# Patient Record
Sex: Female | Born: 1985 | Race: White | Hispanic: No | Marital: Married | Smoking: Former smoker
Health system: Southern US, Community
[De-identification: ages and names within clinical notes are randomized; demographics above are authoritative.]

## PROBLEM LIST (undated history)

## (undated) DIAGNOSIS — IMO0002 Reserved for concepts with insufficient information to code with codable children: Secondary | ICD-10-CM

## (undated) DIAGNOSIS — F419 Anxiety disorder, unspecified: Secondary | ICD-10-CM

## (undated) DIAGNOSIS — K589 Irritable bowel syndrome without diarrhea: Secondary | ICD-10-CM

## (undated) DIAGNOSIS — F32A Depression, unspecified: Secondary | ICD-10-CM

## (undated) DIAGNOSIS — I10 Essential (primary) hypertension: Secondary | ICD-10-CM

## (undated) DIAGNOSIS — G459 Transient cerebral ischemic attack, unspecified: Secondary | ICD-10-CM

## (undated) DIAGNOSIS — F329 Major depressive disorder, single episode, unspecified: Secondary | ICD-10-CM

## (undated) DIAGNOSIS — N920 Excessive and frequent menstruation with regular cycle: Secondary | ICD-10-CM

## (undated) DIAGNOSIS — N946 Dysmenorrhea, unspecified: Secondary | ICD-10-CM

## (undated) HISTORY — DX: Essential (primary) hypertension: I10

## (undated) HISTORY — DX: Reserved for concepts with insufficient information to code with codable children: IMO0002

## (undated) HISTORY — DX: Major depressive disorder, single episode, unspecified: F32.9

## (undated) HISTORY — DX: Dysmenorrhea, unspecified: N94.6

## (undated) HISTORY — DX: Irritable bowel syndrome, unspecified: K58.9

## (undated) HISTORY — DX: Depression, unspecified: F32.A

## (undated) HISTORY — DX: Transient cerebral ischemic attack, unspecified: G45.9

## (undated) HISTORY — DX: Excessive and frequent menstruation with regular cycle: N92.0

## (undated) HISTORY — PX: UPPER GI ENDOSCOPY: SHX6162

## (undated) HISTORY — DX: Anxiety disorder, unspecified: F41.9

---
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
## 2007-11-01 ENCOUNTER — Emergency Department: Payer: Self-pay | Admitting: Emergency Medicine

## 2008-08-11 ENCOUNTER — Ambulatory Visit: Payer: Self-pay | Admitting: Family Medicine

## 2008-11-03 ENCOUNTER — Inpatient Hospital Stay: Payer: Self-pay | Admitting: Internal Medicine

## 2009-01-06 ENCOUNTER — Ambulatory Visit: Payer: Self-pay | Admitting: Obstetrics and Gynecology

## 2009-01-07 ENCOUNTER — Ambulatory Visit: Payer: Self-pay | Admitting: Obstetrics and Gynecology

## 2009-04-13 ENCOUNTER — Ambulatory Visit: Payer: Self-pay | Admitting: Family Medicine

## 2010-04-25 ENCOUNTER — Ambulatory Visit: Payer: Self-pay | Admitting: Internal Medicine

## 2011-02-07 ENCOUNTER — Ambulatory Visit: Payer: Self-pay | Admitting: Internal Medicine

## 2011-04-06 HISTORY — PX: LAPAROSCOPY: SHX197

## 2011-04-27 ENCOUNTER — Ambulatory Visit: Payer: Self-pay | Admitting: Obstetrics and Gynecology

## 2011-04-27 LAB — BASIC METABOLIC PANEL
Calcium, Total: 8.8 mg/dL (ref 8.5–10.1)
Chloride: 107 mmol/L (ref 98–107)
Co2: 27 mmol/L (ref 21–32)
EGFR (African American): >60
EGFR (Non-African Amer.): >60
Glucose: 82 mg/dL (ref 65–99)
Osmolality: 281 (ref 275–301)
Potassium: 4.3 mmol/L (ref 3.5–5.1)
Sodium: 142 mmol/L (ref 136–145)

## 2011-04-27 LAB — CBC
HCT: 41.5 % (ref 35.0–47.0)
HGB: 13.9 g/dL (ref 12.0–16.0)
MCHC: 33.6 g/dL (ref 32.0–36.0)
MCV: 85 fL (ref 80–100)
Platelet: 210 10*3/uL (ref 150–440)
RBC: 4.87 10*6/uL (ref 3.80–5.20)
RDW: 13.3 % (ref 11.5–14.5)

## 2011-05-06 ENCOUNTER — Ambulatory Visit: Payer: Self-pay | Admitting: Obstetrics and Gynecology

## 2011-05-06 LAB — PREGNANCY, URINE: Pregnancy Test, Urine: NEGATIVE m[IU]/mL

## 2011-05-07 LAB — PATHOLOGY REPORT

## 2013-01-25 ENCOUNTER — Ambulatory Visit: Payer: Self-pay | Admitting: Gastroenterology

## 2013-07-19 ENCOUNTER — Emergency Department: Payer: Self-pay | Admitting: Emergency Medicine

## 2013-07-19 LAB — CBC WITH DIFFERENTIAL/PLATELET
BASOS PCT: 0.4 %
Basophil #: 0 10*3/uL (ref 0.0–0.1)
Eosinophil #: 0.1 10*3/uL (ref 0.0–0.7)
Eosinophil %: 1.4 %
HCT: 45.2 % (ref 35.0–47.0)
HGB: 15.2 g/dL (ref 12.0–16.0)
LYMPHS PCT: 29 %
Lymphocyte #: 2.5 10*3/uL (ref 1.0–3.6)
MCH: 29.5 pg (ref 26.0–34.0)
MCHC: 33.8 g/dL (ref 32.0–36.0)
MCV: 87 fL (ref 80–100)
MONO ABS: 0.6 x10 3/mm (ref 0.2–0.9)
MONOS PCT: 6.7 %
Neutrophil #: 5.4 10*3/uL (ref 1.4–6.5)
Neutrophil %: 62.5 %
Platelet: 195 10*3/uL (ref 150–440)
RBC: 5.17 10*6/uL (ref 3.80–5.20)
RDW: 12.2 % (ref 11.5–14.5)
WBC: 8.7 10*3/uL (ref 3.6–11.0)

## 2013-07-19 LAB — BASIC METABOLIC PANEL
Anion Gap: 6 — ABNORMAL LOW (ref 7–16)
BUN: 13 mg/dL (ref 7–18)
CALCIUM: 9 mg/dL (ref 8.5–10.1)
CHLORIDE: 105 mmol/L (ref 98–107)
CREATININE: 0.64 mg/dL (ref 0.60–1.30)
Co2: 25 mmol/L (ref 21–32)
EGFR (African American): >60
EGFR (Non-African Amer.): >60
Glucose: 82 mg/dL (ref 65–99)
Osmolality: 271 (ref 275–301)
Potassium: 3.7 mmol/L (ref 3.5–5.1)
Sodium: 136 mmol/L (ref 136–145)

## 2013-07-19 LAB — URINALYSIS, COMPLETE
BLOOD: NEGATIVE
Bacteria: NONE SEEN
Bilirubin,UR: NEGATIVE
Glucose,UR: NEGATIVE mg/dL (ref 0–75)
KETONE: NEGATIVE
Leukocyte Esterase: NEGATIVE
NITRITE: NEGATIVE
Ph: 6 (ref 4.5–8.0)
Protein: NEGATIVE
RBC,UR: NONE SEEN /HPF (ref 0–5)
SPECIFIC GRAVITY: 1.004 (ref 1.003–1.030)
Squamous Epithelial: <1

## 2013-07-19 LAB — DRUG SCREEN, URINE
Amphetamines, Ur Screen: NEGATIVE (ref ?–1000)
BENZODIAZEPINE, UR SCRN: NEGATIVE (ref ?–200)
Barbiturates, Ur Screen: NEGATIVE (ref ?–200)
COCAINE METABOLITE, UR ~~LOC~~: NEGATIVE (ref ?–300)
Cannabinoid 50 Ng, Ur ~~LOC~~: NEGATIVE (ref ?–50)
MDMA (Ecstasy)Ur Screen: NEGATIVE (ref ?–500)
METHADONE, UR SCREEN: NEGATIVE (ref ?–300)
Opiate, Ur Screen: NEGATIVE (ref ?–300)
Phencyclidine (PCP) Ur S: NEGATIVE (ref ?–25)
Tricyclic, Ur Screen: NEGATIVE (ref ?–1000)

## 2013-07-19 LAB — PREGNANCY, URINE: Pregnancy Test, Urine: NEGATIVE m[IU]/mL

## 2013-07-19 LAB — MAGNESIUM: MAGNESIUM: 2.1 mg/dL

## 2014-07-28 NOTE — Op Note (Signed)
PATIENT NAME:  Cindy Wiggins, Cindy Wiggins MR#:  [id number] DATE OF BIRTH:  1986-02-01  DATE OF PROCEDURE:  05/06/2011  PREOPERATIVE DIAGNOSES:  1. Pelvic pain. 2. Need for replacement of IUD.   POSTOPERATIVE DIAGNOSES: 1. Pelvic pain. 2. Need for replacement of IUD.  PROCEDURES:  1. Diagnostic laparoscopy. 2. Peritoneal biopsies. 3. Removal of copper IUD with placement of Mirena IUD.   ESTIMATED BLOOD LOSS: Minimal.   ANESTHESIA: General endotracheal and local.   SURGEON: Elliot Gurneyarrie C. Fendi Meinhardt, MD   DESCRIPTION OF PROCEDURE: The patient was taken to the operating room and placed in supine position. After adequate general endotracheal anesthesia was instilled, the patient was prepped and draped in the usual sterile fashion. Time-out was performed and a side-opening speculum was placed in the patient's vagina. Hulka was unable to be placed into the uterus so the IUD was removed and a single-tooth tenaculum was then replaced. Attention was then turned to the umbilicus which was injected with Marcaine. An infraumbilical skin incision was made. Veress needle was placed after Marcaine. Hanging drop test, fluid instillation test, and fluid aspiration test showed proper placement of the Veress needle. CO2 was placed on low flow. When tympany was heard around the liver, CO2 was placed on high flow. Veress needle was removed and trocars placed under direct visualization. The patient was placed in Trendelenburg. Photographs were taken. The patient was found to have large amounts of abdominal fat hanging from the anterior abdominal wall slightly obscuring the view. The uterus was elevated and survey of the pelvis was performed. Normal tubes and ovaries were seen bilaterally. One small white scar tissue appearing patch was found posterior to the uterus and was biopsied as well as another one off to the left uterosacral, however, there were no lesions that looked like true endometriosis. The camera was then removed and CO2 was  allowed to escape from the abdomen. The 5 mm trocar port was removed. UR-6 2-0 Vicryl was then used to approximate the fascia at the umbilicus. 4-0 Monocryl was used to approximate the skin edges. Dermabond was placed. Telfa and Tegaderm was placed.   Attention was then turned to the vagina where the side-opening speculum was placed in the patient's vagina. Single-tooth tenaculum was removed. The uterus was sounded and the IUD was then placed without difficulty. Strings were cut. The patient was laid supine with clear urine noted on straight cath.    ____________________________ Elliot Gurneyarrie C. Rohini Jaroszewski, MD cck:drc D: 05/06/2011 23:28:25 ET T: 05/07/2011 11:55:06 ET JOB#: [id number]  cc: Elliot Gurneyarrie C. Cecilio Ohlrich, MD, <Dictator> Elliot GurneyARRIE C Tarrah Furuta MD ELECTRONICALLY SIGNED 05/07/2011 22:27

## 2015-01-07 ENCOUNTER — Ambulatory Visit: Payer: Self-pay | Admitting: Obstetrics and Gynecology

## 2015-01-30 ENCOUNTER — Ambulatory Visit (INDEPENDENT_AMBULATORY_CARE_PROVIDER_SITE_OTHER): Payer: BLUE CROSS/BLUE SHIELD | Admitting: Obstetrics and Gynecology

## 2015-01-30 ENCOUNTER — Encounter: Payer: Self-pay | Admitting: Obstetrics and Gynecology

## 2015-01-30 VITALS — BP 110/75 | HR 65 | Ht 66.0 in | Wt 238.3 lb

## 2015-01-30 DIAGNOSIS — N809 Endometriosis, unspecified: Secondary | ICD-10-CM

## 2015-01-30 DIAGNOSIS — Z8669 Personal history of other diseases of the nervous system and sense organs: Secondary | ICD-10-CM | POA: Diagnosis not present

## 2015-01-30 DIAGNOSIS — N941 Unspecified dyspareunia: Secondary | ICD-10-CM | POA: Diagnosis not present

## 2015-01-30 DIAGNOSIS — Z8673 Personal history of transient ischemic attack (TIA), and cerebral infarction without residual deficits: Secondary | ICD-10-CM | POA: Diagnosis not present

## 2015-01-30 DIAGNOSIS — I10 Essential (primary) hypertension: Secondary | ICD-10-CM | POA: Diagnosis not present

## 2015-01-30 DIAGNOSIS — N946 Dysmenorrhea, unspecified: Secondary | ICD-10-CM

## 2015-01-30 DIAGNOSIS — G459 Transient cerebral ischemic attack, unspecified: Secondary | ICD-10-CM | POA: Diagnosis not present

## 2015-01-30 DIAGNOSIS — F418 Other specified anxiety disorders: Secondary | ICD-10-CM | POA: Diagnosis not present

## 2015-01-30 DIAGNOSIS — Z86718 Personal history of other venous thrombosis and embolism: Secondary | ICD-10-CM | POA: Diagnosis not present

## 2015-01-30 DIAGNOSIS — R102 Pelvic and perineal pain: Secondary | ICD-10-CM

## 2015-01-30 DIAGNOSIS — F329 Major depressive disorder, single episode, unspecified: Secondary | ICD-10-CM | POA: Insufficient documentation

## 2015-01-30 DIAGNOSIS — F419 Anxiety disorder, unspecified: Secondary | ICD-10-CM

## 2015-01-30 DIAGNOSIS — F32A Depression, unspecified: Secondary | ICD-10-CM

## 2015-01-30 MED ORDER — OXYCODONE-ACETAMINOPHEN 5-325 MG PO TABS: 1.0000 | ORAL_TABLET | Freq: Four times a day (QID) | ORAL | Status: DC | PRN

## 2015-01-30 NOTE — Patient Instructions (Signed)
1.  Laparoscopy with peritoneal biopsies and fulguration of endometriosis is scheduled today for first available date. 2.  Percocet prescription is given for help with pain relief.  When severe. 3.  Continue with Advil 800 mg 3 times a day for pain. 4.  Return for preop appointment.

## 2015-01-30 NOTE — Progress Notes (Signed)
Chief complaint: 1.  Symptomatic endometriosis, worsening. 2.  Conference regarding management.  Patient is a 29 year old married white female, para 0, using Mirena IUD for contraception and management of dysmenorrhea and bleeding, presents for further discussion regarding further evaluation and treatment of endometriosis.  Patient had diagnosis of endometriosis by laparoscopy greater than 3 years ago.  Her symptoms following IUD insertion have worsened.  Now recently over the past 6 months where her bleeding is associated with worsening pelvic pain.  Patient is planning on definitive surgery through hysterectomy with a lateral salpingectomy and left oophorectomy in early 2017.  However, because of her worsening symptoms, she is interested in proceeding with laparoscopy to assess for progression of disease.  She also would like treatment if possible.  If disease is identified.  She understands that we can proceed with excision and fulguration of any implants.  Will also give us better information regarding planning for definitive route of hysterectomy. Patient is taking maximal strength Motrin for control of symptoms.  I have recommended that she have a limited prescription of Percocet for narcotic help with severe pain that is refractory to the Motrin.  She will use this as sparingly as possible.  IMPRESSION: 1.  Worsening endometriosis with pelvic pain, causing disruption of routine activities including loss of work days. 2.  No recent laparoscopic evaluation of pelvis regarding extensive disease (last laparoscopy was in 2013).  PLAN: 1.  Scheduled laparoscopy with excision and fulguration of endometriosis. 2.  Prescription for Percocet; 30 tablets written. 3.  Return for preoperative appointment prior to laparoscopy. 4.  Anticipate hysterectomy, bilateral salpingectomy and left oophorectomy to be done in early 2017.  A total of 15 minutes were spent face-to-face with the patient during this  encounter and over half of that time dealt with counseling and coordination of care.  Herold HarmsMartin A Mozes Sagar, MD  Note: This dictation was prepared with Dragon dictation along with smaller phrase technology. Any transcriptional errors that result from this process are unintentional.

## 2015-02-11 ENCOUNTER — Ambulatory Visit (INDEPENDENT_AMBULATORY_CARE_PROVIDER_SITE_OTHER): Payer: BLUE CROSS/BLUE SHIELD | Admitting: Obstetrics and Gynecology

## 2015-02-11 ENCOUNTER — Encounter: Payer: Self-pay | Admitting: Obstetrics and Gynecology

## 2015-02-11 ENCOUNTER — Encounter
Admission: RE | Admit: 2015-02-11 | Discharge: 2015-02-11 | Disposition: A | Discharge status: Home / Self Care | Payer: BLUE CROSS/BLUE SHIELD | Source: Ambulatory Visit | Attending: Obstetrics and Gynecology | Admitting: Obstetrics and Gynecology

## 2015-02-11 VITALS — BP 111/74 | HR 94 | Ht 65.0 in | Wt 238.3 lb

## 2015-02-11 DIAGNOSIS — Z01812 Encounter for preprocedural laboratory examination: Secondary | ICD-10-CM | POA: Insufficient documentation

## 2015-02-11 DIAGNOSIS — Z01818 Encounter for other preprocedural examination: Secondary | ICD-10-CM | POA: Insufficient documentation

## 2015-02-11 DIAGNOSIS — R102 Pelvic and perineal pain: Secondary | ICD-10-CM

## 2015-02-11 DIAGNOSIS — N809 Endometriosis, unspecified: Secondary | ICD-10-CM

## 2015-02-11 LAB — CBC WITH DIFFERENTIAL/PLATELET
BASOS ABS: 0 10*3/uL (ref 0–0.1)
BASOS PCT: 0 %
EOS PCT: 2 %
Eosinophils Absolute: 0.1 10*3/uL (ref 0–0.7)
HCT: 42.2 % (ref 35.0–47.0)
Hemoglobin: 14.4 g/dL (ref 12.0–16.0)
Lymphocytes Relative: 31 %
Lymphs Abs: 1.9 10*3/uL (ref 1.0–3.6)
MCH: 29.5 pg (ref 26.0–34.0)
MCHC: 34.2 g/dL (ref 32.0–36.0)
MCV: 86.3 fL (ref 80.0–100.0)
Monocytes Absolute: 0.6 10*3/uL (ref 0.2–0.9)
Monocytes Relative: 10 %
NEUTROS ABS: 3.6 10*3/uL (ref 1.4–6.5)
Neutrophils Relative %: 57 %
PLATELETS: 182 10*3/uL (ref 150–440)
RBC: 4.89 MIL/uL (ref 3.80–5.20)
RDW: 12.4 % (ref 11.5–14.5)
WBC: 6.3 10*3/uL (ref 3.6–11.0)

## 2015-02-11 LAB — RAPID HIV SCREEN (HIV 1/2 AB+AG)
HIV 1/2 ANTIBODIES: NONREACTIVE
HIV-1 P24 ANTIGEN - HIV24: NONREACTIVE

## 2015-02-11 NOTE — Patient Instructions (Signed)
  Your procedure is scheduled on:11/14.16 Report to Day Surgery. To find out your arrival time please call [phone] between 1PM - 3PM on 02/14/15.  Remember: Instructions that are not followed completely may result in serious medical risk, up to and including death, or upon the discretion of your surgeon and anesthesiologist your surgery may need to be rescheduled.    _x___ 1. Do not eat food or drink liquids after midnight. No gum chewing or hard candies.     __x__ 2. No Alcohol for 24 hours before or after surgery.   ____ 3. Bring all medications with you on the day of surgery if instructed.    __x__ 4. Notify your doctor if there is any change in your medical condition     (cold, fever, infections).     Do not wear jewelry, make-up, hairpins, clips or nail polish.  Do not wear lotions, powders, or perfumes. You may wear deodorant.  Do not shave 48 hours prior to surgery. Men may shave face and neck.  Do not bring valuables to the hospital.    [facility] any belongings or valuables.               Contacts, dentures or bridgework may not be worn into surgery.  Leave your suitcase in the car. After surgery it may be brought to your room.  For patients admitted to the hospital, discharge time is determined by your                treatment team.   Patients discharged the day of surgery will not be allowed to drive home.   Please read over the following fact sheets that you were given:   Surgical Site Infection Prevention   ____ Take these medicines the morning of surgery with A SIP OF WATER:    1. Bring diovan-hctz to hospital  2.   3.   4.  5.  6.  ____ Fleet Enema (as directed)   __x__ Use CHG Soap as directed  ____ Use inhalers on the day of surgery  ____ Stop metformin 2 days prior to surgery    ____ Take 1/2 of usual insulin dose the night before surgery and none on the morning of surgery.   ____ Stop Coumadin/Plavix/aspirin on  ____  Stop Anti-inflammatories on tylenol only for pain   ____ Stop supplements until after surgery.    ____ Bring C-Pap to the hospital.

## 2015-02-11 NOTE — H&P (Signed)
PREOPERATIVE HISTORY AND PHYSICAL  Date of surgery: 02/17/2015 Chief complaint: 1.Symptomatic endometriosis, worsening.  Subjective:  1. Symptomatic endometriosis, worsening.   Patient is a 29-year-old married white female, para 0, using Mirena IUD for contraception and management of dysmenorrhea and bleeding, presents for surgical evaluation and treatment of endometriosis. Patient had diagnosis of endometriosis by laparoscopy greater than 3 years ago. Her symptoms following IUD insertion have worsened. Now recently over the past 6 months where her bleeding is associated with worsening pelvic pain. Patient is planning on definitive surgery through hysterectomy with a BIlateral salpingectomy and left oophorectomy in early 2017. However, because of her worsening symptoms, she is interested in proceeding with laparoscopy to assess for progression of disease. She also would like treatment if possible if disease is identified. She understands that we can proceed with excision and fulguration of any implants. Will also give us better information regarding planning for definitive route of hysterectomy. Patient is taking maximal strength Motrin for control of symptoms. I have recommended that she have a limited prescription of Percocet for narcotic help with severe pain that is refractory to the Motrin.    Pertinent Gynecological History: Contraception: Mirena IUD. Long history of severe dysmenorrhea, Deep thrusting dyspareunia. Diagnosis of endometriosis made by laparoscopy   Menstrual History: OB History    Gravida Para Term Preterm AB TAB SAB Ectopic Multiple Living   0 0 0 0 0 0 0 0 0 0      Menarche age: na  No LMP recorded. Patient is not currently having periods (Reason: IUD).    Past Medical History  Diagnosis Date  . TIA (transient ischemic attack)   . Painful intercourse   . Heavy period   . Anxiety   . Depression   . Hypertension   . Painful menstrual periods      Past Surgical History  Procedure Laterality Date  . Laparoscopy  2013    OB History  Gravida Para Term Preterm AB SAB TAB Ectopic Multiple Living  0 0 0 0 0 0 0 0 0 0        Social History   Social History  . Marital Status: Married    Spouse Name: N/A  . Number of Children: N/A  . Years of Education: N/A   Social History Main Topics  . Smoking status: Current Some Day Smoker  . Smokeless tobacco: None  . Alcohol Use: Yes     Comment: occas  . Drug Use: No  . Sexual Activity: Yes    Birth Control/ Protection: IUD   Other Topics Concern  . None   Social History Narrative    Family History  Problem Relation Age of Onset  . Colon cancer Father   . Breast cancer Paternal Aunt   . Colon cancer Paternal Grandfather   . Heart disease Neg Hx   . Ovarian cancer Neg Hx      (Not in a hospital admission)  Allergies  Allergen Reactions  . Imitrex [Sumatriptan]     Review of Systems Constitutional: No recent fever/chills/sweats Respiratory: No recent cough/bronchitis Cardiovascular: No chest pain Gastrointestinal: No recent nausea/vomiting/diarrhea Genitourinary: No UTI symptoms Hematologic/lymphatic:No history of coagulopathy or recent blood thinner use    Objective:    BP 111/74 mmHg  Pulse 94  Ht 5' 5" (1.651 m)  Wt 238 lb 4.8 oz (108.092 kg)  BMI 39.66 kg/m2  General:   Normal  Skin:   normal  HEENT:  Normal  Neck:  Supple without Adenopathy   or Thyromegaly  Lungs:   Heart:              Breasts:   Abdomen:  Pelvis:  M/S   Extremeties:  Neuro:    clear to auscultation bilaterally   Normal without murmur   Not Examined   soft, non-tender; bowel sounds normal; no masses,  no organomegaly   Exam deferred to OR  No CVAT  Warm/Dry   Normal          Assessment:    IMPRESSION: 1. Worsening endometriosis with pelvic pain, causing disruption of routine activities including loss of work days. 2. No recent laparoscopic evaluation of  pelvis regarding extensive disease (last laparoscopy was in 2013).     Plan:     PLAN: 1. Laparoscopy with excision and fulguration of endometriosis. 2. Anticipate hysterectomy, bilateral salpingectomy and left oophorectomy to be done in early 2017.   Counseling: Procedure, risks, reasons, benefits and complications (including injury to bowel, bladder, major blood vessel, ureter, bleeding, possibility of transfusion, infection, or fistula formation) reviewed in detail. Consent signed.  Herold HarmsMartin A Geniece Akers, MD  Note: This dictation was prepared with Dragon dictation along with smaller phrase technology. Any transcriptional errors that result from this process are unintentional.

## 2015-02-11 NOTE — Progress Notes (Signed)
Patient ID: Cindy Wiggins, female   DOB: 1985/04/07, 29 y.o.   MRN: [id number] n  PREOPERATIVE HISTORY AND PHYSICAL  Date of surgery: 02/17/2015 Chief complaint: 1.Symptomatic endometriosis, worsening.  Subjective:  1. Symptomatic endometriosis, worsening.   Patient is a 29 year old married white female, para 0, using Mirena IUD for contraception and management of dysmenorrhea and bleeding, presents for surgical evaluation and treatment of endometriosis. Patient had diagnosis of endometriosis by laparoscopy greater than 3 years ago. Her symptoms following IUD insertion have worsened. Now recently over the past 6 months where her bleeding is associated with worsening pelvic pain. Patient is planning on definitive surgery through hysterectomy with a BIlateral salpingectomy and left oophorectomy in early 2017. However, because of her worsening symptoms, she is interested in proceeding with laparoscopy to assess for progression of disease. She also would like treatment if possible if disease is identified. She understands that we can proceed with excision and fulguration of any implants. Will also give [place] better information regarding planning for definitive route of hysterectomy. Patient is taking maximal strength Motrin for control of symptoms. I have recommended that she have a limited prescription of Percocet for narcotic help with severe pain that is refractory to the Motrin.    Pertinent Gynecological History: Contraception: Mirena IUD. Long history of severe dysmenorrhea, Deep thrusting dyspareunia. Diagnosis of endometriosis made by laparoscopy   Menstrual History: OB History    Gravida Para Term Preterm AB TAB SAB Ectopic Multiple Living        Menarche age: na  No LMP recorded. Patient is not currently having periods (Reason: IUD).    Past Medical History  Diagnosis Date  . TIA (transient ischemic attack)   . Painful intercourse   . Heavy period   .  Anxiety   . Depression   . Hypertension   . Painful menstrual periods     Past Surgical History  Procedure Laterality Date  . Laparoscopy  2013    OB History  Gravida Para Term Preterm AB SAB TAB Ectopic Multiple Living         Social History   Social History  . Marital Status: Married    Spouse Name: N/A  . Number of Children: N/A  . Years of Education: N/A   Social History Main Topics  . Smoking status: Current Some Day Smoker  . Smokeless tobacco: None  . Alcohol Use: Yes     Comment: occas  . Drug Use: No  . Sexual Activity: Yes    Birth Control/ Protection: IUD   Other Topics Concern  . None   Social History Narrative    Family History  Problem Relation Age of Onset  . Colon cancer Father   . Breast cancer Paternal Aunt   . Colon cancer Paternal Grandfather   . Heart disease Neg Hx   . Ovarian cancer Neg Hx      (Not in a hospital admission)  Allergies  Allergen Reactions  . Imitrex [Sumatriptan]     Review of Systems Constitutional: No recent fever/chills/sweats Respiratory: No recent cough/bronchitis Cardiovascular: No chest pain Gastrointestinal: No recent nausea/vomiting/diarrhea Genitourinary: No UTI symptoms Hematologic/lymphatic:No history of coagulopathy or recent blood thinner use    Objective:    BP 111/74 mmHg  Pulse 94  Ht  (1.651 m)  Wt 238 lb 4.8 oz (108.092 kg)  BMI 39.66 kg/m2  General:  Normal  Skin:   normal  HEENT:  Normal  Neck:  Supple without Adenopathy or Thyromegaly  Lungs:   Heart:              Breasts:   Abdomen:  Pelvis:  M/S   Extremeties:  Neuro:    clear to auscultation bilaterally   Normal without murmur   Not Examined   soft, non-tender; bowel sounds normal; no masses,  no organomegaly   Exam deferred to OR  No CVAT  Warm/Dry   Normal          Assessment:    IMPRESSION: 1. Worsening endometriosis with pelvic pain, causing disruption of routine  activities including loss of work days. 2. No recent laparoscopic evaluation of pelvis regarding extensive disease (last laparoscopy was in 2013).     Plan:     PLAN: 1. Laparoscopy with excision and fulguration of endometriosis. 2. Anticipate hysterectomy, bilateral salpingectomy and left oophorectomy to be done in early 2017.   Counseling: Procedure, risks, reasons, benefits and complications (including injury to bowel, bladder, major blood vessel, ureter, bleeding, possibility of transfusion, infection, or fistula formation) reviewed in detail. Consent signed.  Herold HarmsMartin A Rikayla Demmon, MD  Note: This dictation was prepared with Dragon dictation along with smaller phrase technology. Any transcriptional errors that result from this process are unintentional.

## 2015-02-11 NOTE — Patient Instructions (Signed)
1. Return in 1 week for postop check following surgery 

## 2015-02-12 LAB — RPR: RPR: NONREACTIVE

## 2015-02-17 ENCOUNTER — Ambulatory Visit: Payer: BLUE CROSS/BLUE SHIELD | Admitting: Anesthesiology

## 2015-02-17 ENCOUNTER — Ambulatory Visit
Admission: RE | Admit: 2015-02-17 | Discharge: 2015-02-17 | Disposition: A | Discharge status: Home / Self Care | Payer: BLUE CROSS/BLUE SHIELD | Source: Ambulatory Visit | Attending: Obstetrics and Gynecology | Admitting: Obstetrics and Gynecology

## 2015-02-17 ENCOUNTER — Encounter: Payer: Self-pay | Admitting: Obstetrics and Gynecology

## 2015-02-17 ENCOUNTER — Encounter
Admission: RE | Disposition: A | Discharge status: Home / Self Care | Payer: Self-pay | Source: Ambulatory Visit | Attending: Obstetrics and Gynecology

## 2015-02-17 DIAGNOSIS — N946 Dysmenorrhea, unspecified: Secondary | ICD-10-CM | POA: Diagnosis not present

## 2015-02-17 DIAGNOSIS — R102 Pelvic and perineal pain: Secondary | ICD-10-CM | POA: Insufficient documentation

## 2015-02-17 DIAGNOSIS — N802 Endometriosis of fallopian tube: Secondary | ICD-10-CM | POA: Diagnosis not present

## 2015-02-17 DIAGNOSIS — Z800 Family history of malignant neoplasm of digestive organs: Secondary | ICD-10-CM | POA: Diagnosis not present

## 2015-02-17 DIAGNOSIS — F329 Major depressive disorder, single episode, unspecified: Secondary | ICD-10-CM | POA: Insufficient documentation

## 2015-02-17 DIAGNOSIS — G8929 Other chronic pain: Secondary | ICD-10-CM | POA: Diagnosis not present

## 2015-02-17 DIAGNOSIS — F419 Anxiety disorder, unspecified: Secondary | ICD-10-CM | POA: Insufficient documentation

## 2015-02-17 DIAGNOSIS — N803 Endometriosis of pelvic peritoneum: Secondary | ICD-10-CM | POA: Diagnosis not present

## 2015-02-17 DIAGNOSIS — N941 Unspecified dyspareunia: Secondary | ICD-10-CM | POA: Insufficient documentation

## 2015-02-17 DIAGNOSIS — N809 Endometriosis, unspecified: Secondary | ICD-10-CM | POA: Diagnosis not present

## 2015-02-17 DIAGNOSIS — Z8673 Personal history of transient ischemic attack (TIA), and cerebral infarction without residual deficits: Secondary | ICD-10-CM | POA: Diagnosis not present

## 2015-02-17 DIAGNOSIS — Z803 Family history of malignant neoplasm of breast: Secondary | ICD-10-CM | POA: Diagnosis not present

## 2015-02-17 DIAGNOSIS — F17200 Nicotine dependence, unspecified, uncomplicated: Secondary | ICD-10-CM | POA: Insufficient documentation

## 2015-02-17 DIAGNOSIS — Z888 Allergy status to other drugs, medicaments and biological substances status: Secondary | ICD-10-CM | POA: Insufficient documentation

## 2015-02-17 DIAGNOSIS — I10 Essential (primary) hypertension: Secondary | ICD-10-CM | POA: Diagnosis not present

## 2015-02-17 HISTORY — PX: LAPAROSCOPY: SHX197

## 2015-02-17 LAB — POCT PREGNANCY, URINE: PREG TEST UR: NEGATIVE

## 2015-02-17 SURGERY — LAPAROSCOPY OPERATIVE
Anesthesia: General

## 2015-02-17 MED ORDER — LACTATED RINGERS IV SOLN: INTRAVENOUS | Status: DC

## 2015-02-17 MED ORDER — LIDOCAINE HCL (CARDIAC) 20 MG/ML IV SOLN
INTRAVENOUS | Status: DC | PRN
  Administered 2015-02-17: 100 mg via INTRAVENOUS

## 2015-02-17 MED ORDER — ONDANSETRON HCL 4 MG/2ML IJ SOLN: 4.0000 mg | Freq: Once | INTRAMUSCULAR | Status: DC | PRN

## 2015-02-17 MED ORDER — DEXAMETHASONE SODIUM PHOSPHATE 4 MG/ML IJ SOLN
INTRAMUSCULAR | Status: DC | PRN
  Administered 2015-02-17: 10 mg via INTRAVENOUS

## 2015-02-17 MED ORDER — IBUPROFEN 800 MG PO TABS: 800.0000 mg | ORAL_TABLET | Freq: Three times a day (TID) | ORAL | Status: DC

## 2015-02-17 MED ORDER — FENTANYL CITRATE (PF) 100 MCG/2ML IJ SOLN
INTRAMUSCULAR | Status: AC
  Administered 2015-02-17: 25 ug via INTRAVENOUS
  Filled 2015-02-17: qty 2

## 2015-02-17 MED ORDER — FENTANYL CITRATE (PF) 100 MCG/2ML IJ SOLN
INTRAMUSCULAR | Status: DC | PRN
  Administered 2015-02-17 (×2): 50 ug via INTRAVENOUS

## 2015-02-17 MED ORDER — MIDAZOLAM HCL 2 MG/2ML IJ SOLN
INTRAMUSCULAR | Status: DC | PRN
  Administered 2015-02-17: 2 mg via INTRAVENOUS

## 2015-02-17 MED ORDER — GLYCOPYRROLATE 0.2 MG/ML IJ SOLN
INTRAMUSCULAR | Status: DC | PRN
  Administered 2015-02-17: .6 mg via INTRAVENOUS

## 2015-02-17 MED ORDER — OXYCODONE-ACETAMINOPHEN 5-325 MG PO TABS: 1.0000 | ORAL_TABLET | ORAL | Status: DC | PRN

## 2015-02-17 MED ORDER — FENTANYL CITRATE (PF) 100 MCG/2ML IJ SOLN
25.0000 ug | INTRAMUSCULAR | Status: DC | PRN
  Administered 2015-02-17 (×2): 25 ug via INTRAVENOUS

## 2015-02-17 MED ORDER — ROCURONIUM BROMIDE 100 MG/10ML IV SOLN
INTRAVENOUS | Status: DC | PRN
  Administered 2015-02-17: 30 mg via INTRAVENOUS
  Administered 2015-02-17: 10 mg via INTRAVENOUS
  Administered 2015-02-17: 20 mg via INTRAVENOUS

## 2015-02-17 MED ORDER — PROPOFOL 10 MG/ML IV BOLUS
INTRAVENOUS | Status: DC | PRN
  Administered 2015-02-17: 200 mg via INTRAVENOUS

## 2015-02-17 MED ORDER — OXYCODONE-ACETAMINOPHEN 5-325 MG PO TABS
ORAL_TABLET | ORAL | Status: AC
  Administered 2015-02-17: 11:00:00
  Filled 2015-02-17: qty 1

## 2015-02-17 MED ORDER — FAMOTIDINE 20 MG PO TABS
ORAL_TABLET | ORAL | Status: AC
  Administered 2015-02-17: 20 mg via ORAL
  Filled 2015-02-17: qty 1

## 2015-02-17 MED ORDER — BUPIVACAINE-EPINEPHRINE (PF) 0.5% -1:200000 IJ SOLN
INTRAMUSCULAR | Status: AC
  Filled 2015-02-17: qty 30

## 2015-02-17 MED ORDER — LACTATED RINGERS IV SOLN
INTRAVENOUS | Status: DC
  Administered 2015-02-17: 07:00:00 via INTRAVENOUS

## 2015-02-17 MED ORDER — NEOSTIGMINE METHYLSULFATE 10 MG/10ML IV SOLN
INTRAVENOUS | Status: DC | PRN
  Administered 2015-02-17: 3 mg via INTRAVENOUS

## 2015-02-17 MED ORDER — FAMOTIDINE 20 MG PO TABS
20.0000 mg | ORAL_TABLET | Freq: Once | ORAL | Status: AC
  Administered 2015-02-17: 20 mg via ORAL

## 2015-02-17 MED ORDER — ONDANSETRON HCL 4 MG/2ML IJ SOLN
INTRAMUSCULAR | Status: DC | PRN
  Administered 2015-02-17: 4 mg via INTRAVENOUS

## 2015-02-17 SURGICAL SUPPLY — 30 items
BLADE SURG SZ11 CARB STEEL (BLADE) ×2 IMPLANT
BLADE TIP J-PLASMA PRECISE LAP (MISCELLANEOUS) ×2 IMPLANT
BNDG ADH 2 X3.75 FABRIC TAN LF (GAUZE/BANDAGES/DRESSINGS) ×6 IMPLANT
CANISTER SUCT 1200ML W/VALVE (MISCELLANEOUS) ×2 IMPLANT
CATH ROBINSON RED A/P 16FR (CATHETERS) ×2 IMPLANT
CHLORAPREP W/TINT 26ML (MISCELLANEOUS) ×2 IMPLANT
GLOVE BIO SURGEON STRL SZ8 (GLOVE) ×2 IMPLANT
GLOVE INDICATOR 8.0 STRL GRN (GLOVE) ×2 IMPLANT
GOWN STRL REUS W/ TWL LRG LVL3 (GOWN DISPOSABLE) ×1 IMPLANT
GOWN STRL REUS W/ TWL XL LVL3 (GOWN DISPOSABLE) ×1 IMPLANT
GOWN STRL REUS W/TWL LRG LVL3 (GOWN DISPOSABLE) ×1
GOWN STRL REUS W/TWL XL LVL3 (GOWN DISPOSABLE) ×1
IRRIGATION STRYKERFLOW (MISCELLANEOUS) ×1 IMPLANT
IRRIGATOR STRYKERFLOW (MISCELLANEOUS) ×2
IV LACTATED RINGERS 1000ML (IV SOLUTION) ×2 IMPLANT
KIT RM TURNOVER CYSTO AR (KITS) ×2 IMPLANT
LABEL OR SOLS (LABEL) ×2 IMPLANT
NS IRRIG 1000ML POUR BTL (IV SOLUTION) ×2 IMPLANT
NS IRRIG 500ML POUR BTL (IV SOLUTION) ×2 IMPLANT
PACK GYN LAPAROSCOPIC (MISCELLANEOUS) ×2 IMPLANT
PAD OB MATERNITY 4.3X12.25 (PERSONAL CARE ITEMS) ×2 IMPLANT
PAD PREP 24X41 OB/GYN DISP (PERSONAL CARE ITEMS) ×2 IMPLANT
POUCH ENDO CATCH 10MM SPEC (MISCELLANEOUS) IMPLANT
SCISSORS METZENBAUM CVD 33 (INSTRUMENTS) IMPLANT
SLEEVE ENDOPATH XCEL 5M (ENDOMECHANICALS) ×2 IMPLANT
SUT PLAIN 4 0 FS 2 27 (SUTURE) ×2 IMPLANT
SUT VIC AB 0 CT2 27 (SUTURE) ×2 IMPLANT
SUT VIC AB 0 UR5 27 (SUTURE) ×2 IMPLANT
TROCAR XCEL NON-BLD 5MMX100MML (ENDOMECHANICALS) ×2 IMPLANT
TUBING INSUFFLATOR HI FLOW (MISCELLANEOUS) ×2 IMPLANT

## 2015-02-17 NOTE — Anesthesia Postprocedure Evaluation (Signed)
  Anesthesia Post-op Note  Patient: Cindy Wiggins  Procedure(s) Performed: Procedure(s): LAPAROSCOPY OPERATIVE/ WITH EXCISION AND FULGERATION  of endometrosis (N/A)  Anesthesia type:General  Patient location: PACU  Post pain: Pain level controlled  Post assessment: Post-op Vital signs reviewed, Patient's Cardiovascular Status Stable, Respiratory Function Stable, Patent Airway and No signs of Nausea or vomiting  Post vital signs: Reviewed and stable  Last Vitals:  Filed Vitals:   02/17/15 0920  BP: 132/72  Pulse: 76  Temp: 37.1 C  Resp: 23    Level of consciousness: awake, alert  and patient cooperative  Complications: No apparent anesthesia complications

## 2015-02-17 NOTE — Op Note (Signed)
Theodora BlowMegan W Ploch PROCEDURE DATE: 02/17/2015 9:34 AM  PREOPERATIVE DIAGNOSIS: ENDOMETRIOSIS,PELVIC PAIN  POSTOPERATIVE DIAGNOSIS:  1. Endometriosis 2. Chronic pelvic pain PROCEDURE: Procedure(s): LAPAROSCOPY OPERATIVE/ WITH EXCISION AND FULGERATION  of endometrosis (N/A) SURGEON:  Dr. Daphine DeutscherMartin A Woodroe Vogan ASSISTANT: None ANESTHESIA: General Endotracheal  INDICATIONS: 29 y.o. G0P0000 with history of chronic pelvic pain concerning for endometriosis desiring surgical evaluation.   Please see preoperative notes for further details.   FINDINGS:  Small uterus, normal ovaries and fallopian tubes bilaterally.  Pseudo-fenestrations within the cul-de-sac and white lesions suspicious for endometriosis were identified. A 5 mm left fallopian tube white cystic lesion was also noted, possibly consistent with endometriosis I/O's: Total I/O In: 500 [I.V.:500] Out: 65 [Urine:50; Blood:15] SPECIMENS: Peritoneal biopsies cul-de-sac peritoneum (left, central) COMPLICATIONS: None immediate COUNTS:  YES  PROCEDURE IN DETAIL: The patient was brought to the operating room where she was placed in the supine position.  General endotracheal anesthesia was induced without difficulty.  She was placed in the dorsal lithotomy position using bumblebee stirrups.  A ChloraPrep and Betadine, abdominal, perineal, intravaginal prep and drape was performed in standard fashion.  The timeout was completed.  The red Robison catheter was used to drain the bladder of clear urine.  A weighted speculum was placed into the vagina and a Hulka tenaculum was placed onto the cervix to facilitate uterine manipulation. Laparoscopy was performed in standard fashion.  The Optiview 5 mm trocar and sleeve were placed through a 5 mm subumbilical incision directly into the abdominal pelvic cavity, without evidence of bowel or vascular injury. 2 Additional 5 mm ports were placed in the lower quadrants, respectively, under direct visualization.  The above  noted findings were photo documented. Patient was brought to the operating room she was placed in supine position. A ChloraPrep and Betadine abdominal perineal intravaginal prep and drape was performed in standard fashion. Timeout was completed. Red Robinson catheter was used to drain 50 mL of urine from the bladder. Laparoscopy was performed in standard fashion. 5 mm subumbilical incision was placed. The Optiview laparoscopic trocar system was used to place a 5 mm port directly into the abdominal pelvic cavity without evidence of bowel or vascular injury. 2 additional 5 mm ports were placed in the right and left lower quadrant the above-noted findings were photo documented. The peritoneal biopsies of the left cul-de-sac and central cul-de-sac were taken. Pelvis was irrigated and irrigant fluid was aspirated. The deep} crit was then used to fulgurate the endometriosis implants in the cul-de-sac and along the left fallopian tube. Adhesions in the right lower quadrant between the bowel and the pelvic sidewall were taken down with the J plasma device. Once satisfied with survey of the pelvis and hemostasis, the procedure was terminated with all instrumentation being removed from the abdominal pelvic cavity. Pneumoperitoneum was released. Incisions were closed with 4-0 Vicryl simple interrupted sutures. Telfa and OpSite dressings were placed. Patient was then awakened mobilized and taken to recovery in satisfactory condition.  Herold HarmsMartin A Donovin Kraemer, MD [facility]  ADDENDUM: The patient has a gynecoid pelvis. She is an excellent candidate for transvaginal hysterectomy in the future.

## 2015-02-17 NOTE — Anesthesia Preprocedure Evaluation (Signed)
Anesthesia Evaluation  Patient identified by MRN, date of birth, ID band Patient awake    Reviewed: Allergy & Precautions, NPO status , Patient's Chart, lab work & pertinent test results  History of Anesthesia Complications Negative for: history of anesthetic complications  Airway Mallampati: II       Dental no notable dental hx. (+) Teeth Intact   Pulmonary Current Smoker,    Pulmonary exam normal        Cardiovascular hypertension, Pt. on medications + DVT  Normal cardiovascular exam     Neuro/Psych Anxiety Depression CVA    GI/Hepatic negative GI ROS, Neg liver ROS,   Endo/Other  negative endocrine ROS  Renal/GU negative Renal ROS     Musculoskeletal negative musculoskeletal ROS (+)   Abdominal Normal abdominal exam  (+)   Peds negative pediatric ROS (+)  Hematology negative hematology ROS (+)   Anesthesia Other Findings   Reproductive/Obstetrics                             Anesthesia Physical Anesthesia Plan  ASA: II  Anesthesia Plan: General   Post-op Pain Management:    Induction: Intravenous  Airway Management Planned: Oral ETT  Additional Equipment:   Intra-op Plan:   Post-operative Plan: Extubation in OR  Informed Consent: I have reviewed the patients History and Physical, chart, labs and discussed the procedure including the risks, benefits and alternatives for the proposed anesthesia with the patient or authorized representative who has indicated his/her understanding and acceptance.     Plan Discussed with: CRNA  Anesthesia Plan Comments:         Anesthesia Quick Evaluation

## 2015-02-17 NOTE — H&P (View-Only) (Signed)
PREOPERATIVE HISTORY AND PHYSICAL  Date of surgery: 02/17/2015 Chief complaint: 1.Symptomatic endometriosis, worsening.  Subjective:  1. Symptomatic endometriosis, worsening.   Patient is a 29 year old married white female, para 0, using Mirena IUD for contraception and management of dysmenorrhea and bleeding, presents for surgical evaluation and treatment of endometriosis. Patient had diagnosis of endometriosis by laparoscopy greater than 3 years ago. Her symptoms following IUD insertion have worsened. Now recently over the past 6 months where her bleeding is associated with worsening pelvic pain. Patient is planning on definitive surgery through hysterectomy with a BIlateral salpingectomy and left oophorectomy in early 2017. However, because of her worsening symptoms, she is interested in proceeding with laparoscopy to assess for progression of disease. She also would like treatment if possible if disease is identified. She understands that we can proceed with excision and fulguration of any implants. Will also give [place] better information regarding planning for definitive route of hysterectomy. Patient is taking maximal strength Motrin for control of symptoms. I have recommended that she have a limited prescription of Percocet for narcotic help with severe pain that is refractory to the Motrin.    Pertinent Gynecological History: Contraception: Mirena IUD. Long history of severe dysmenorrhea, Deep thrusting dyspareunia. Diagnosis of endometriosis made by laparoscopy   Menstrual History: OB History    Gravida Para Term Preterm AB TAB SAB Ectopic Multiple Living        Menarche age: na  No LMP recorded. Patient is not currently having periods (Reason: IUD).    Past Medical History  Diagnosis Date  . TIA (transient ischemic attack)   . Painful intercourse   . Heavy period   . Anxiety   . Depression   . Hypertension   . Painful menstrual periods      Past Surgical History  Procedure Laterality Date  . Laparoscopy  2013    OB History  Gravida Para Term Preterm AB SAB TAB Ectopic Multiple Living         Social History   Social History  . Marital Status: Married    Spouse Name: N/A  . Number of Children: N/A  . Years of Education: N/A   Social History Main Topics  . Smoking status: Current Some Day Smoker  . Smokeless tobacco: None  . Alcohol Use: Yes     Comment: occas  . Drug Use: No  . Sexual Activity: Yes    Birth Control/ Protection: IUD   Other Topics Concern  . None   Social History Narrative    Family History  Problem Relation Age of Onset  . Colon cancer Father   . Breast cancer Paternal Aunt   . Colon cancer Paternal Grandfather   . Heart disease Neg Hx   . Ovarian cancer Neg Hx      (Not in a hospital admission)  Allergies  Allergen Reactions  . Imitrex [Sumatriptan]     Review of Systems Constitutional: No recent fever/chills/sweats Respiratory: No recent cough/bronchitis Cardiovascular: No chest pain Gastrointestinal: No recent nausea/vomiting/diarrhea Genitourinary: No UTI symptoms Hematologic/lymphatic:No history of coagulopathy or recent blood thinner use    Objective:    BP 111/74 mmHg  Pulse 94  Ht  (1.651 m)  Wt 238 lb 4.8 oz (108.092 kg)  BMI 39.66 kg/m2  General:   Normal  Skin:   normal  HEENT:  Normal  Neck:  Supple without Adenopathy  or Thyromegaly  Lungs:   Heart:              Breasts:   Abdomen:  Pelvis:  M/S   Extremeties:  Neuro:    clear to auscultation bilaterally   Normal without murmur   Not Examined   soft, non-tender; bowel sounds normal; no masses,  no organomegaly   Exam deferred to OR  No CVAT  Warm/Dry   Normal          Assessment:    IMPRESSION: 1. Worsening endometriosis with pelvic pain, causing disruption of routine activities including loss of work days. 2. No recent laparoscopic evaluation of  pelvis regarding extensive disease (last laparoscopy was in 2013).     Plan:     PLAN: 1. Laparoscopy with excision and fulguration of endometriosis. 2. Anticipate hysterectomy, bilateral salpingectomy and left oophorectomy to be done in early 2017.   Counseling: Procedure, risks, reasons, benefits and complications (including injury to bowel, bladder, major blood vessel, ureter, bleeding, possibility of transfusion, infection, or fistula formation) reviewed in detail. Consent signed.  Herold HarmsMartin A Defrancesco, MD  Note: This dictation was prepared with Dragon dictation along with smaller phrase technology. Any transcriptional errors that result from this process are unintentional.

## 2015-02-17 NOTE — Interval H&P Note (Signed)
History and Physical Interval Note:  02/17/2015 7:30 AM  Cindy Wiggins  has presented today for surgery, with the diagnosis of ENDOMETRIOSIS,PELVIC PAIN  The various methods of treatment have been discussed with the patient and family. After consideration of risks, benefits and other options for treatment, the patient has consented to  Procedure(s): LAPAROSCOPY OPERATIVE/ WITH EXCISION AND FULGERATION  (N/A) as a surgical intervention .  The patient's history has been reviewed, patient examined, no change in status, stable for surgery.  I have reviewed the patient's chart and labs.  Questions were answered to the patient's satisfaction.     Daphine DeutscherMartin A Carlas Vandyne

## 2015-02-17 NOTE — Discharge Instructions (Signed)
Laparoscopic lysis of abdominal adhesions is a surgical procedure to remove stringy (fibrous) bands of tissue from the abdomen. During the procedure a lighted, pencil-sized instrument (laparoscope) is inserted into the abdomen through an incision. The laparoscope allows your health care provider to look at the organs inside of your body. LET [facility] CARE PROVIDER KNOW ABOUT:  Any allergies you have.  All medicines you are taking, including vitamins, herbs, eye drops, creams, and over-the-counter medicines.  Previous problems you or members of your family have had with the use of anesthetics.  Any blood disorders you have.  Previous surgeries you have had.  Any medical conditions you may have. RISKS AND COMPLICATIONS Generally, this is a safe procedure. However, problems may occur, including:  Injury to abdominal organs.  Bleeding.  Infection.  Development of more abdominal adhesions. BEFORE THE PROCEDURE  You may have imaging tests and blood tests.  Ask your health care provider about:  Changing or stopping your regular medicines. This is especially important if you are taking diabetes medicines or blood thinners.  Taking medicines such as aspirin and ibuprofen. These medicines can thin your blood. Do not take these medicines before your procedure if your health care provider instructs you not to.  Follow instructions from your health care provider about eating or drinking restrictions. PROCEDURE  An IV tube will be inserted into one of your veins.  You will be given a medicine that makes you fall asleep (general anesthetic).  A tube may be passed through your nose and into your stomach. This is called a nasogastric tube.  The surgeon will make a small incision through the wall of your abdomen.  Your abdomen will be filled with a gas. This will help your surgeon to see the inside your abdomen more clearly. It will also give him or her more room to operate.  A  laparoscope with a camera on the end will be passed through the incision. It will send images to a monitor in the operating room.  Other small incisions may be made in your abdomen. Long, thin surgical instruments will be inserted through them as needed.  The adhesions will be cut with surgical scissors or removed with some form of energy, such as electric current or laser energy.  The incisions will be closed with adhesive strips or stitches (sutures).  A bandage (dressing) will be placed over the incisions. The procedure may vary among health care providers and hospitals. AFTER THE PROCEDURE  You may have to spend one or more nights in the hospital.  You may have some pain. You will get pain medicine as needed.  You will be encouraged to get up and walk around while you are still in the hospital.  The nasogastric tube will be removed when your bowel function returns. After that, you will be allowed to eat or drink as usual.  When you are taking fluids well, your IV tube will be removed.   This information is not intended to replace advice given to you by your health care provider. Make sure you discuss any questions you have with your health care provider.   General Anesthesia, Adult, Care After Refer to this sheet in the next few weeks. These instructions provide you with information on caring for yourself after your procedure. Your health care provider may also give you more specific instructions. Your treatment has been planned according to current medical practices, but problems sometimes occur. Call your health care provider if you have any problems  or questions after your procedure. WHAT TO EXPECT AFTER THE PROCEDURE After the procedure, it is typical to experience:  Sleepiness.  Nausea and vomiting. HOME CARE INSTRUCTIONS  For the first 24 hours after general anesthesia:  Have a responsible person with you.  Do not drive a car. If you are alone, do not take public  transportation.  Do not drink alcohol.  Do not take medicine that has not been prescribed by your health care provider.  Do not sign important papers or make important decisions.  You may resume a normal diet and activities as directed by your health care provider.  Change bandages (dressings) as directed.  If you have questions or problems that seem related to general anesthesia, call the hospital and ask for the anesthetist or anesthesiologist on call. SEEK MEDICAL CARE IF:  You have nausea and vomiting that continue the day after anesthesia.  You develop a rash. SEEK IMMEDIATE MEDICAL CARE IF:   You have difficulty breathing.  You have chest pain.  You have any allergic problems.   This information is not intended to replace advice given to you by your health care provider. Make sure you discuss any questions you have with your health care provider.   Document Released: 06/28/2000 Document Revised: 04/12/2014 Document Reviewed: 07/21/2011 Elsevier Interactive Patient Education 2016 Elsevier Inc. Document Released: 06/18/2008 Document Revised: 08/06/2014 Document Reviewed: 03/18/2014 Elsevier Interactive Patient Education [employer] Inc.

## 2015-02-17 NOTE — Anesthesia Procedure Notes (Signed)
Procedure Name: Intubation Date/Time: 02/17/2015 7:42 AM Performed by: Michaele OfferSAVAGE, Corryn Madewell Pre-anesthesia Checklist: Patient identified, Emergency Drugs available, Suction available, Patient being monitored and Timeout performed Patient Re-evaluated:Patient Re-evaluated prior to inductionOxygen Delivery Method: Circle system utilized Preoxygenation: Pre-oxygenation with 100% oxygen Intubation Type: IV induction Ventilation: Mask ventilation without difficulty Laryngoscope Size: Mac and 3 Grade View: Grade I Tube type: Oral Tube size: 7.0 mm Number of attempts: 1 Airway Equipment and Method: Rigid stylet Placement Confirmation: ETT inserted through vocal cords under direct vision,  positive ETCO2 and breath sounds checked- equal and bilateral Secured at: 21 cm Tube secured with: Tape Dental Injury: Teeth and Oropharynx as per pre-operative assessment

## 2015-02-17 NOTE — Transfer of Care (Signed)
Immediate Anesthesia Transfer of Care Note  Patient: Cindy Wiggins  Procedure(s) Performed: Procedure(s): LAPAROSCOPY OPERATIVE/ WITH EXCISION AND FULGERATION  of endometrosis (N/A)  Patient Location: PACU  Anesthesia Type:General  Level of Consciousness: awake, alert , oriented and patient cooperative  Airway & Oxygen Therapy: Patient Spontanous Breathing and Patient connected to face mask oxygen  Post-op Assessment: Report given to RN, Post -op Vital signs reviewed and stable and Patient moving all extremities X 4  Post vital signs: Reviewed and stable  Last Vitals:  Filed Vitals:   02/17/15 0920  BP: 132/72  Pulse: 76  Temp: 37.1 C  Resp: 23    Complications: No apparent anesthesia complications

## 2015-02-18 LAB — SURGICAL PATHOLOGY

## 2015-02-25 ENCOUNTER — Encounter: Payer: Self-pay | Admitting: Obstetrics and Gynecology

## 2015-02-25 ENCOUNTER — Ambulatory Visit (INDEPENDENT_AMBULATORY_CARE_PROVIDER_SITE_OTHER): Payer: BLUE CROSS/BLUE SHIELD | Admitting: Obstetrics and Gynecology

## 2015-02-25 ENCOUNTER — Other Ambulatory Visit: Payer: Self-pay

## 2015-02-25 VITALS — BP 109/71 | HR 78 | Ht 65.0 in | Wt 238.0 lb

## 2015-02-25 DIAGNOSIS — N946 Dysmenorrhea, unspecified: Secondary | ICD-10-CM

## 2015-02-25 DIAGNOSIS — Z09 Encounter for follow-up examination after completed treatment for conditions other than malignant neoplasm: Secondary | ICD-10-CM

## 2015-02-25 NOTE — Progress Notes (Signed)
Patient ID: Cindy Wiggins, female   DOB: 08/20/1985, 29 y.o.   MRN: [id number]   Chief complaint: 1.  Postop check. 2.  Status post laparoscopy with excision and fulguration of endometriosis.    1 week post op lap w excision and fulguration  no pains meds for 5 day No vb x 1 week Bladder/bowel- wnl No issues with incision  Pathology:Cul-de-sac biopsies-mesothelial aligned fibrous tissue with chronic nonspecific inflammation; no evidence of endometriosis.  Findings were reviewed and questions are answered.  ASSESSMENT: 1.  History of severe dysmenorrhea and chronic pelvic pain. 2.  Family history of endometriosis. 3.  Status post laparoscopy without biopsy proven endometriosis. Status post obliteration of areas concerning for endometriosis Using the J plasma device  PLAN: 1. Resume all activities without restriction. 2.  Monitor symptoms of pelvic pain to see if therapy helped improve symptoms 3.  Return in 3 months for follow-up.  Herold HarmsMartin A Shana Younge, MD  Note: This dictation was prepared with Dragon dictation along with smaller phrase technology. Any transcriptional errors that result from this process are unintentional.

## 2015-03-03 ENCOUNTER — Inpatient Hospital Stay: Admission: RE | Admit: 2015-03-03 | Payer: Self-pay | Source: Ambulatory Visit | Admitting: Obstetrics and Gynecology

## 2015-03-03 ENCOUNTER — Encounter: Admission: RE | Payer: Self-pay | Source: Ambulatory Visit

## 2015-03-03 SURGERY — HYSTERECTOMY, VAGINAL, LAPAROSCOPY-ASSISTED, WITH SALPINGO-OOPHORECTOMY
Anesthesia: Choice

## 2015-03-03 NOTE — Patient Instructions (Signed)
1.  Resume all activities without restriction. 2.  Monitor pain symptomatology. 3.  Return in 1 month for follow-up.

## 2015-03-05 ENCOUNTER — Other Ambulatory Visit: Payer: Self-pay | Admitting: Student

## 2015-03-05 DIAGNOSIS — R1084 Generalized abdominal pain: Secondary | ICD-10-CM

## 2015-03-11 ENCOUNTER — Ambulatory Visit
Admission: RE | Admit: 2015-03-11 | Discharge: 2015-03-11 | Disposition: A | Discharge status: Home / Self Care | Payer: BLUE CROSS/BLUE SHIELD | Source: Ambulatory Visit | Attending: Student | Admitting: Student

## 2015-03-11 DIAGNOSIS — R1084 Generalized abdominal pain: Secondary | ICD-10-CM | POA: Diagnosis not present

## 2015-03-11 MED ORDER — IOHEXOL 350 MG/ML SOLN
100.0000 mL | Freq: Once | INTRAVENOUS | Status: AC | PRN
  Administered 2015-03-11: 100 mL via INTRAVENOUS

## 2015-04-08 ENCOUNTER — Ambulatory Visit: Payer: BLUE CROSS/BLUE SHIELD | Admitting: Obstetrics and Gynecology

## 2015-04-10 ENCOUNTER — Encounter: Payer: Self-pay | Admitting: Dietician

## 2015-04-10 ENCOUNTER — Encounter: Payer: BLUE CROSS/BLUE SHIELD | Attending: Student | Admitting: Dietician

## 2015-04-10 VITALS — Ht 65.0 in | Wt 240.6 lb

## 2015-04-10 DIAGNOSIS — K582 Mixed irritable bowel syndrome: Secondary | ICD-10-CM | POA: Diagnosis not present

## 2015-04-10 NOTE — Progress Notes (Signed)
Medical Nutrition Therapy: Visit start time: 1030  end time: 1130  Assessment:  Diagnosis: IBS Past medical history: HTN, CVA several years ago  Psychosocial issues/ stress concerns: depression; resuming medication, and plans to meet with therapist. Preferred learning method:  . Auditory . Visual  Current weight: 240.6lbs  Height: 5'5" Medications, supplements: reviewed list in chart with patient  Progress and evaluation: Patient reports 6-year history of abdominal pains; tests for celiac, crohn's, etc. have been negative.          She has stopped dairy, including cheeses, spicy foods, citrus and salsa as she feels they all upset her stomach. Avoids carbonated drinks.          She is also avoiding red meat due to symptoms of diarrhea.          Following weight loss plan through fitness center. She lost 60lbs until stomach issues progressed, and has now regained about 10lbs. Diet is well-balanced.   Physical activity: cardio, circuit training for 1 hour, 4 times per week.  Dietary Intake:  Usual eating pattern includes 3 meals and 2-3 snacks per day. Dining out frequency: 1-2 meals per week.  Breakfast: protein shake with strawberries, banana (was eating whole grain waffle, banana, peanut butter) Snack: protein shake: powder and water only. Lunch: salad greens with shrimp or chicken, 24 almonds, stopped having mandarin orange (citrus hurts stomach) Snack: was having orange or popcorn. Seeds aggravate stomach.  Supper: 6-8oz protein, rice or red potatoes, no longer salsa. Stir-fry with gluten-free noodles or brown rice. No extra seasoning.  Snack: none. Occasional skinny girl popcorn or a few nuts.  Beverages: water, coffee, no sugar  Nutrition Care Education: Topics covered: IBS, weight management Weight control: reviewed current eating pattern, importance of balanced meals. IBS:  Discussed low-FODMAP diet, possible lactose intolerance, red meat intolerance, acidic foods.  Discussed  possibility of trying low-fiber diet for a period of time to allow for some GI rest. Instructed on low-fiber diet.  Role of protein, fat, fiber, spices in irritating or healing GI tract.   Nutritional Diagnosis:  [place]-2.1 Inpaired nutrition utilization As related to possible food intolerances.  As evidenced by abdominal pain and some diarrhea.  Intervention: Instruction as noted above.   Patient would like to try low-fiber diet for a few weeks and monitor symptoms.   Set goals with patient input.  Education Materials given:  Marland Kitchen. Low-fiber diet (NCDA) . FODMAP diet, grocery list . Goals/ instructions   Learner/ who was taught:  . Patient   Level of understanding: Marland Kitchen. Verbalizes/ demonstrates competency  Demonstrated degree of understanding via:   Teach back Learning barriers: . None  Willingness to learn/ readiness for change: . Eager, change in progress  Monitoring and Evaluation:  Dietary intake, exercise, abdominal symptoms, and body weight      follow up: prn patient to call and report progress; schedule follow-up if needed.

## 2015-04-10 NOTE — Patient Instructions (Signed)
   Try eating low fiber foods for at least 1-2 weeks and see if you have any improvement in symptoms. If so, continue eating low fiber for several more weeks to give your system a rest.   Monitor whether chicory root or inulin fibers that are often present in protein or granola bars cause stomach upset.

## 2015-05-15 ENCOUNTER — Telehealth: Payer: Self-pay | Admitting: Dietician

## 2015-05-15 NOTE — Telephone Encounter (Signed)
Called patient to check on progress with low fiber diet and IBS symptoms. Requested a call back to report progress and to determine whether follow-up is needed.

## 2016-04-14 ENCOUNTER — Ambulatory Visit: Payer: BLUE CROSS/BLUE SHIELD | Admitting: Obstetrics and Gynecology

## 2016-04-20 IMAGING — CT CT ABD-PELV W/ CM
2 of 4 series · 16 of 46 positions shown, 18 images · IV contrast (omnipaque)
Comparison: None.

CLINICAL DATA: Generalized abdominal and pelvic pain for 5 years
with nausea, vomiting, diarrhea and some constipation.

EXAM:
CT ABDOMEN AND PELVIS WITH CONTRAST
TECHNIQUE: Multidetector CT imaging of the abdomen and pelvis was performed
using the standard protocol following bolus administration of
intravenous contrast.
CONTRAST:  100mL OMNIPAQUE IOHEXOL 350 MG/ML SOLN

[Series 2: routine with · axial · 0.62mm/px · z∈[-1069,-634]mm · 13 of 97 slices shown, 15 images]
[im 5/97  soft-tissue]
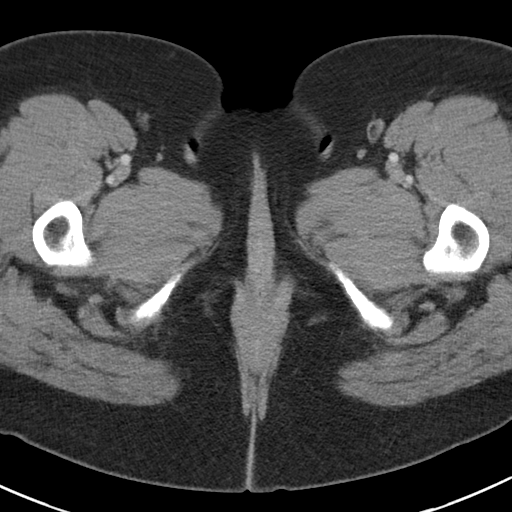
[im 5/97  bone]
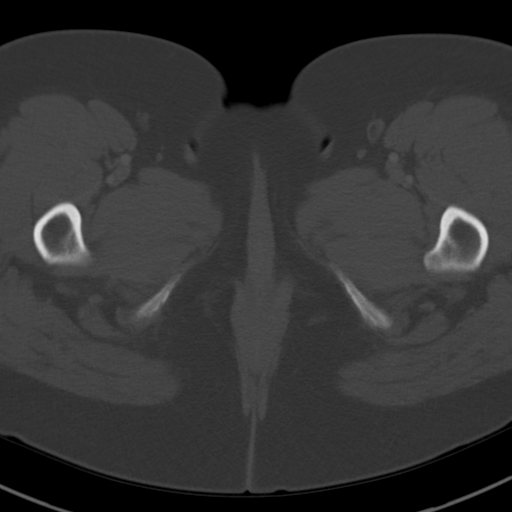
[im 13/97  soft-tissue]
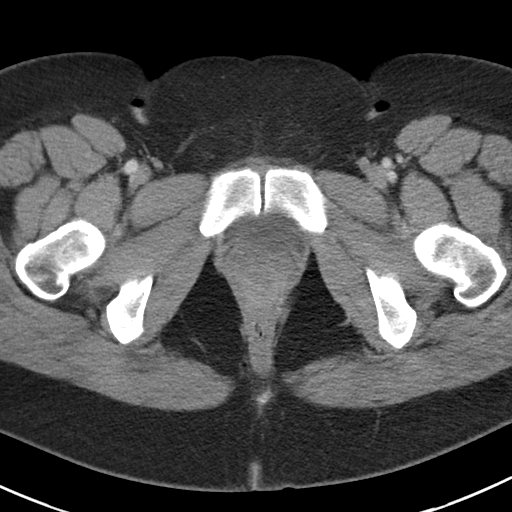
[im 21/97  soft-tissue]
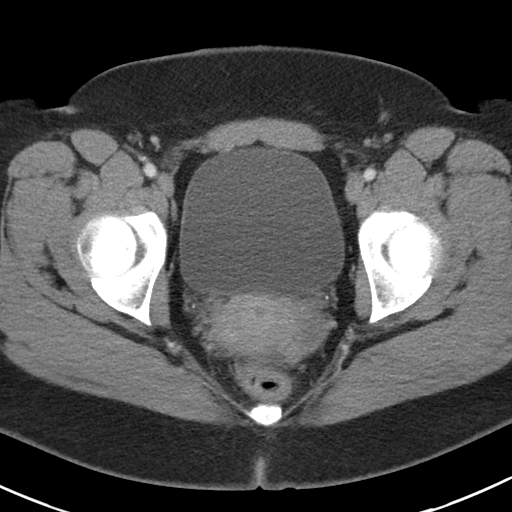
[im 26/97  soft-tissue]
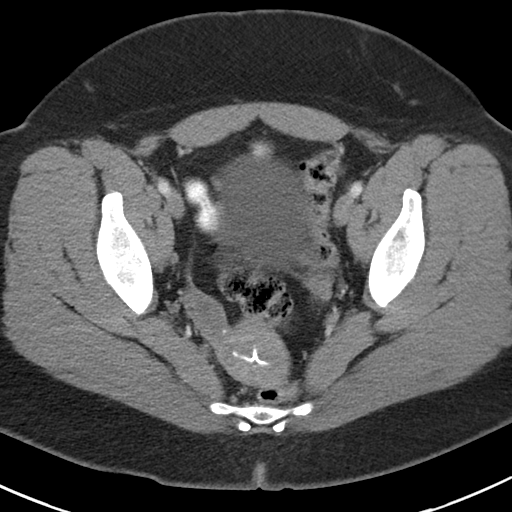
[im 34/97  soft-tissue]
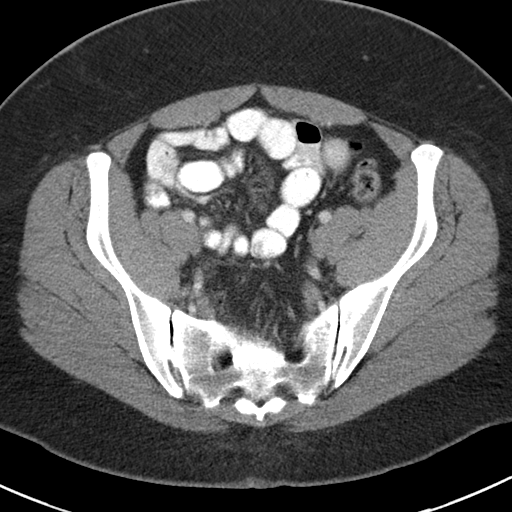
[im 42/97  soft-tissue]
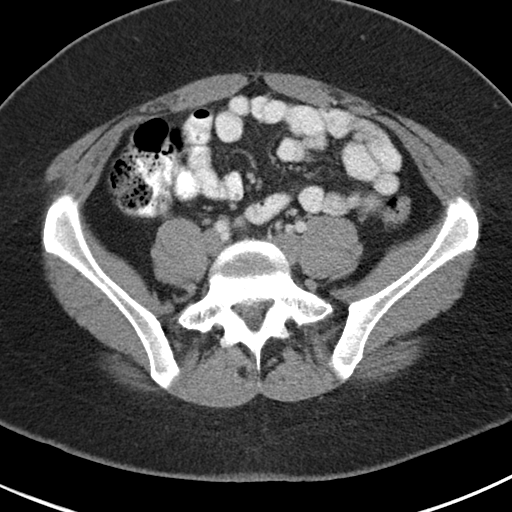
[im 51/97  soft-tissue]
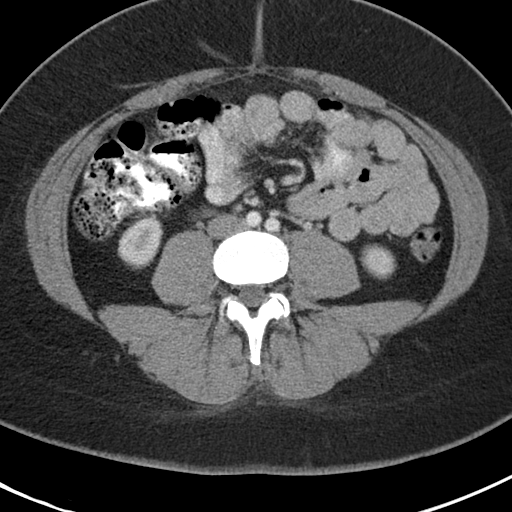
[im 55/97  soft-tissue]
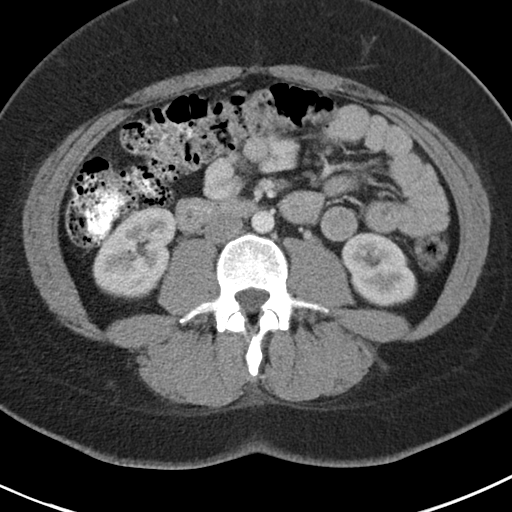
[im 63/97  soft-tissue]
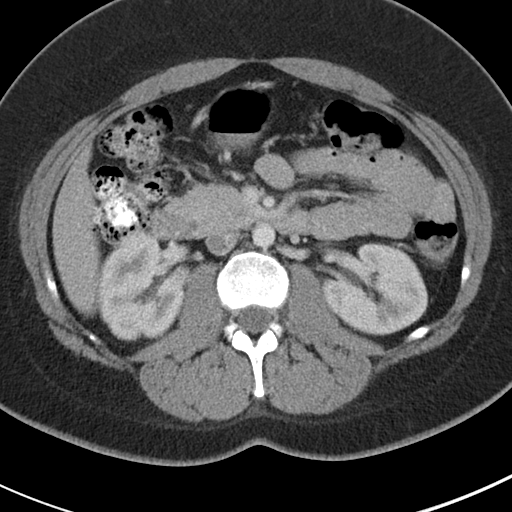
[im 63/97  bone]
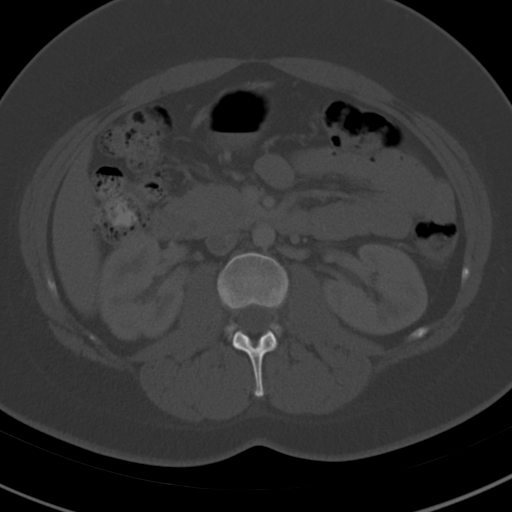
[im 71/97  soft-tissue]
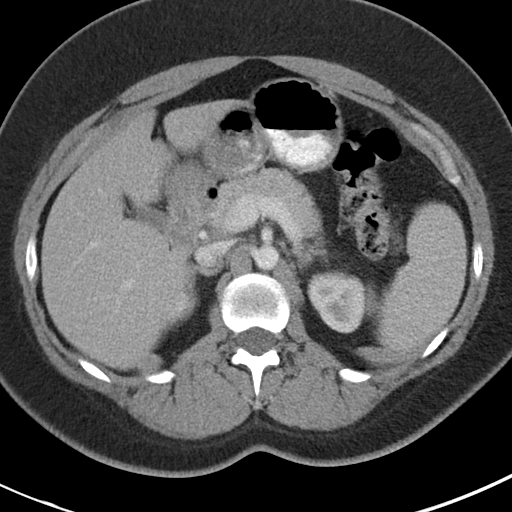
[im 76/97  soft-tissue]
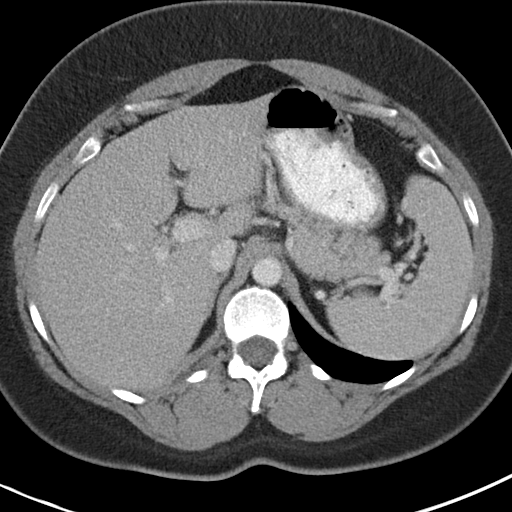
[im 84/97  soft-tissue]
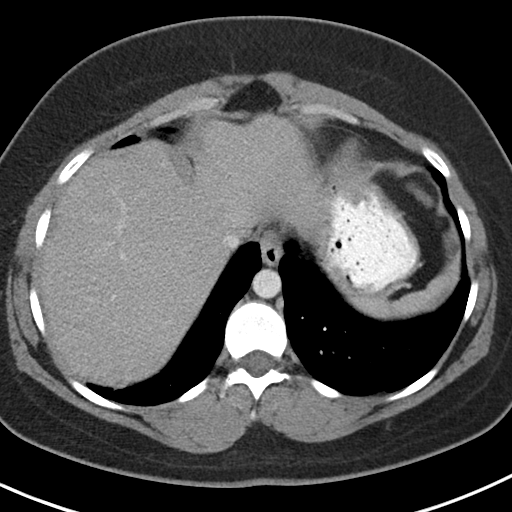
[im 92/97  soft-tissue]
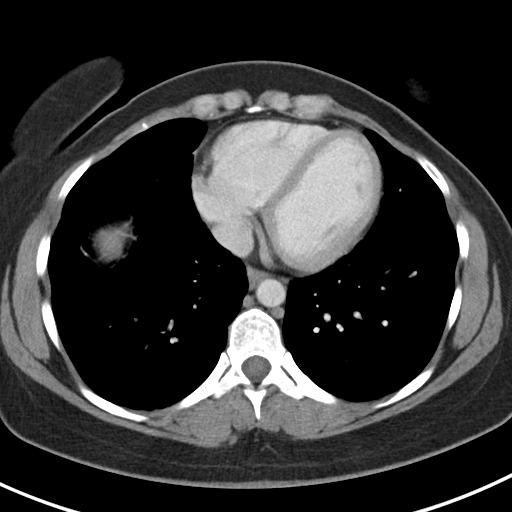

[Series 5: cor routine with · coronal · 0.67mm/px · 3 of 160 slices shown]
[im 54/160  soft-tissue]
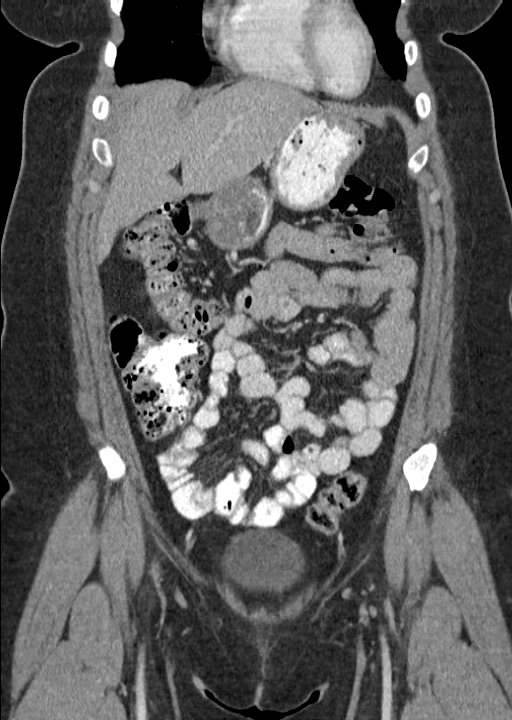
[im 71/160  soft-tissue]
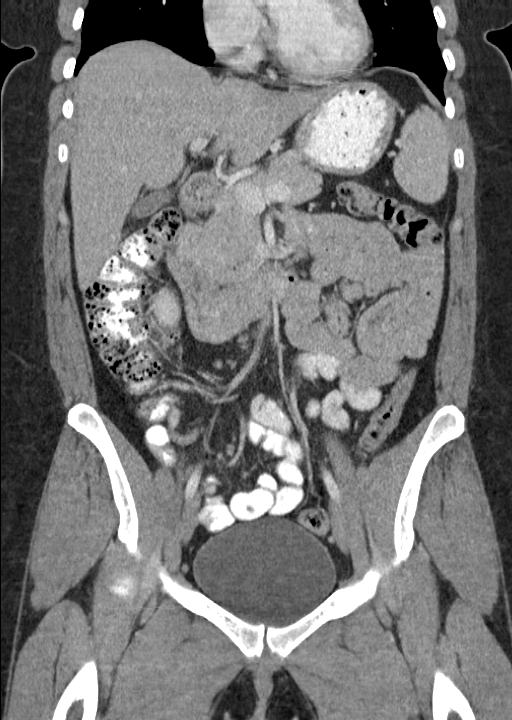
[im 89/160  soft-tissue]
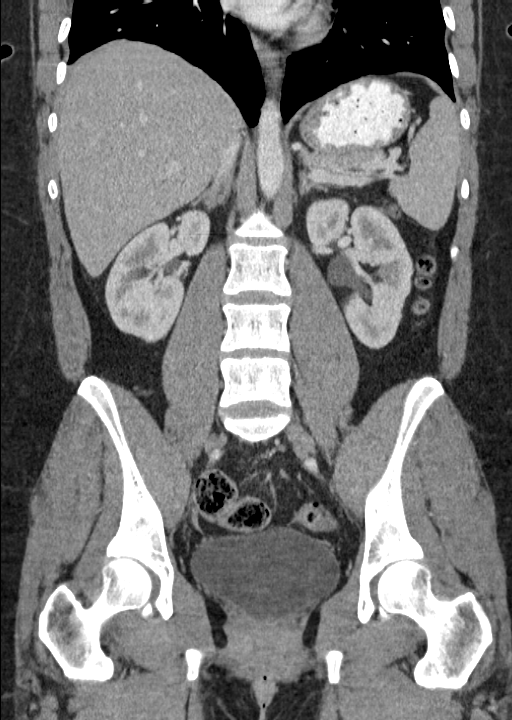

[16 of 46 positions shown; findings below may reference images not displayed]

FINDINGS: Lung bases are clear.  No effusions.  Heart is normal size.

Liver, gallbladder, spleen, pancreas, adrenals and kidneys are
normal. Uterus is retroverted. IUD in place. No adnexal masses.
Urinary bladder is unremarkable. Bowel grossly unremarkable. No free
fluid, free air, or adenopathy. Appendix is visualized and is
normal. Aorta is normal caliber.

No acute bony abnormality or focal bone lesion.
IMPRESSION: No acute findings in the abdomen or pelvis.

## 2016-05-05 ENCOUNTER — Encounter: Payer: Self-pay | Admitting: Obstetrics and Gynecology

## 2016-05-05 ENCOUNTER — Ambulatory Visit (INDEPENDENT_AMBULATORY_CARE_PROVIDER_SITE_OTHER): Payer: BLUE CROSS/BLUE SHIELD | Admitting: Obstetrics and Gynecology

## 2016-05-05 VITALS — BP 122/82 | HR 79 | Ht 65.0 in | Wt 251.8 lb

## 2016-05-05 DIAGNOSIS — Z30432 Encounter for removal of intrauterine contraceptive device: Secondary | ICD-10-CM

## 2016-05-05 DIAGNOSIS — N809 Endometriosis, unspecified: Secondary | ICD-10-CM

## 2016-05-05 NOTE — Progress Notes (Signed)
Chief complaint: 1. Endometriosis 2. IUD removal  Patient presents today for follow-up on endometriosis IUD removal. She is status post laparoscopy with fulguration of endometriosis implants J plasma instrument approximately 1 year ago. She has had a Mirena IUD in place 5 years. She is contemplating pregnancy at this time and would like to remove.  Past medical history, past surgical history, problem list, medications, and allergies are reviewed  OBJECTIVE: BP 122/82   Pulse 79   Ht 5\' 5"  (1.651 m)   Wt 251 lb 12.8 oz (114.2 kg)   LMP 04/28/2016 (Exact Date)   BMI 41.90 kg/m  Pleasant female in no acute distress. Alert and oriented. Abdomen: Soft, nontender Pelvic exam: External genitalia-normal BUS-normal Vagina-normal Cervix-IUD string visualized, 3 cm long; 1/4 cervical motion tenderness Uterus-retroverted, mobile, 1/4 tender, normal size and shape Adnexa-nonpalpable and nontender Rectovaginal-normal external exam  ASSESSMENT: 1. Endometriosis, stable 2. Mirena IUD removal today after 5 years  PLAN: 1. Mirena IUD removal 2. Monitor menses 3. Return in 3 months for follow-up on menses and annual exam 4. Multivitamin daily  A total of 15 minutes were spent face-to-face with the patient during this encounter and over half of that time dealt with counseling and coordination of care.    Herold HarmsMartin A Regie Bunner, MD  Note: This dictation was prepared with Dragon dictation along with smaller phrase technology. Any transcriptional errors that result from this process are unintentional.

## 2016-05-05 NOTE — Patient Instructions (Signed)
1. IUD is removed today 2. Begin taking multivitamins daily 3. Return in 3 months for follow-up on dysmenorrhea/endometriosis

## 2016-08-05 NOTE — Progress Notes (Deleted)
ANNUAL PREVENTATIVE CARE GYN  ENCOUNTER NOTE  Subjective:       Cindy Wiggins is a 31 y.o. G0P0000 female here for a routine annual gynecologic exam.  Current complaints: 1.     Gynecologic History No LMP recorded. Patient is not currently having periods (Reason: IUD). Contraception: none Last Pap:  Results were: normal Last mammogram: n/a. Results were: n/a  Obstetric History OB History  Gravida Para Term Preterm AB Living  0 0 0 0 0 0  SAB TAB Ectopic Multiple Live Births  0 0 0 0          Past Medical History:  Diagnosis Date  . Anxiety   . Depression   . Heavy period   . Hypertension   . IBS (irritable bowel syndrome)   . Painful intercourse   . Painful menstrual periods   . TIA (transient ischemic attack)     Past Surgical History:  Procedure Laterality Date  . LAPAROSCOPY  2013  . LAPAROSCOPY N/A 02/17/2015   Procedure: LAPAROSCOPY OPERATIVE/ WITH EXCISION AND FULGERATION  of endometrosis;  Surgeon: Herold Harms, MD;  Location: [facility] ORS;  Service: Gynecology;  Laterality: N/A;  . UPPER GI ENDOSCOPY      Current Outpatient Prescriptions on File Prior to Visit  Medication Sig Dispense Refill  . levonorgestrel (MIRENA) 20 MCG/24HR IUD 1 each by Intrauterine route once.     No current facility-administered medications on file prior to visit.     Allergies  Allergen Reactions  . Imitrex [Sumatriptan] Nausea And Vomiting  . Sumatriptan Succinate Nausea And Vomiting    Social History   Social History  . Marital status: Married    Spouse name: N/A  . Number of children: N/A  . Years of education: N/A   Occupational History  . Not on file.   Social History Main Topics  . Smoking status: Former Smoker    Packs/day: 0.00    Types: Cigarettes    Quit date: 03/10/2015  . Smokeless tobacco: Never Used  . Alcohol use 0.0 - 0.6 oz/week     Comment: occas  . Drug use: No  . Sexual activity: Yes    Birth control/ protection: IUD   Other Topics  Concern  . Not on file   Social History Narrative  . No narrative on file    Family History  Problem Relation Age of Onset  . Colon cancer Father   . Breast cancer Paternal Aunt   . Colon cancer Paternal Grandfather   . Heart disease Neg Hx   . Ovarian cancer Neg Hx   . Diabetes Neg Hx     The following portions of the patient's history were reviewed and updated as appropriate: allergies, current medications, past family history, past medical history, past social history, past surgical history and problem list.  Review of Systems ROS Review of Systems - General ROS: negative for - chills, fatigue, fever, hot flashes, night sweats, weight gain or weight loss Psychological ROS: negative for - anxiety, decreased libido, depression, mood swings, physical abuse or sexual abuse Ophthalmic ROS: negative for - blurry vision, eye pain or loss of vision ENT ROS: negative for - headaches, hearing change, visual changes or vocal changes Allergy and Immunology ROS: negative for - hives, itchy/watery eyes or seasonal allergies Hematological and Lymphatic ROS: negative for - bleeding problems, bruising, swollen lymph nodes or weight loss Endocrine ROS: negative for - galactorrhea, hair pattern changes, hot flashes, malaise/lethargy, mood swings, palpitations, polydipsia/polyuria, skin  changes, temperature intolerance or unexpected weight changes Breast ROS: negative for - new or changing breast lumps or nipple discharge Respiratory ROS: negative for - cough or shortness of breath Cardiovascular ROS: negative for - chest pain, irregular heartbeat, palpitations or shortness of breath Gastrointestinal ROS: no abdominal pain, change in bowel habits, or black or bloody stools Genito-Urinary ROS: no dysuria, trouble voiding, or hematuria Musculoskeletal ROS: negative for - joint pain or joint stiffness Neurological ROS: negative for - bowel and bladder control changes Dermatological ROS: negative for  rash and skin lesion changes   Objective:   There were no vitals taken for this visit. CONSTITUTIONAL: Well-developed, well-nourished female in no acute distress.  PSYCHIATRIC: Normal mood and affect. Normal behavior. Normal judgment and thought content. NEUROLGIC: Alert and oriented to person, place, and time. Normal muscle tone coordination. No cranial nerve deficit noted. HENT:  Normocephalic, atraumatic, External right and left ear normal. Oropharynx is clear and moist EYES: Conjunctivae and EOM are normal. Pupils are equal, round, and reactive to light. No scleral icterus.  NECK: Normal range of motion, supple, no masses.  Normal thyroid.  SKIN: Skin is warm and dry. No rash noted. Not diaphoretic. No erythema. No pallor. CARDIOVASCULAR: Normal heart rate noted, regular rhythm, no murmur. RESPIRATORY: Clear to auscultation bilaterally. Effort and breath sounds normal, no problems with respiration noted. BREASTS: Symmetric in size. No masses, skin changes, nipple drainage, or lymphadenopathy. ABDOMEN: Soft, normal bowel sounds, no distention noted.  No tenderness, rebound or guarding.  BLADDER: Normal PELVIC:  External Genitalia: Normal  BUS: Normal  Vagina: Normal  Cervix: Normal  Uterus: Normal  Adnexa: Normal  RV: {Blank multiple:19196::"External Exam NormaI","No Rectal Masses","Normal Sphincter tone"}  MUSCULOSKELETAL: Normal range of motion. No tenderness.  No cyanosis, clubbing, or edema.  2+ distal pulses. LYMPHATIC: No Axillary, Supraclavicular, or Inguinal Adenopathy.    Assessment:   Annual gynecologic examination 31 y.o. Contraception: none bmi- Problem List Items Addressed This Visit    Endometriosis   Anxiety and depression    Other Visit Diagnoses    Well woman exam with routine gynecological exam    -  Primary      Plan:  Pap: Pap, Reflex if ASCUS Mammogram: Not Indicated Stool Guaiac Testing:  Not Indicated Labs: ? Routine preventative health  maintenance measures emphasized: {Blank multiple:19196::"Exercise/Diet/Weight control","Tobacco Warnings","Alcohol/Substance use risks","Stress Management","Peer Pressure Issues","Safe Sex"} *** Return to Clinic - [address]   Reeshemah Nazaryan [place], [place]

## 2016-08-11 ENCOUNTER — Encounter: Payer: BLUE CROSS/BLUE SHIELD | Admitting: Obstetrics and Gynecology

## 2016-12-21 ENCOUNTER — Ambulatory Visit (INDEPENDENT_AMBULATORY_CARE_PROVIDER_SITE_OTHER): Payer: BLUE CROSS/BLUE SHIELD | Admitting: Obstetrics and Gynecology

## 2016-12-21 ENCOUNTER — Ambulatory Visit (INDEPENDENT_AMBULATORY_CARE_PROVIDER_SITE_OTHER): Payer: BLUE CROSS/BLUE SHIELD

## 2016-12-21 ENCOUNTER — Encounter: Payer: Self-pay | Admitting: Obstetrics and Gynecology

## 2016-12-21 VITALS — BP 121/84 | HR 89 | Ht 65.0 in | Wt 250.0 lb

## 2016-12-21 DIAGNOSIS — N912 Amenorrhea, unspecified: Secondary | ICD-10-CM

## 2016-12-21 DIAGNOSIS — O10911 Unspecified pre-existing hypertension complicating pregnancy, first trimester: Secondary | ICD-10-CM

## 2016-12-21 DIAGNOSIS — Z8673 Personal history of transient ischemic attack (TIA), and cerebral infarction without residual deficits: Secondary | ICD-10-CM

## 2016-12-21 DIAGNOSIS — Z3201 Encounter for pregnancy test, result positive: Secondary | ICD-10-CM

## 2016-12-21 DIAGNOSIS — F329 Major depressive disorder, single episode, unspecified: Secondary | ICD-10-CM | POA: Diagnosis not present

## 2016-12-21 DIAGNOSIS — N926 Irregular menstruation, unspecified: Secondary | ICD-10-CM

## 2016-12-21 DIAGNOSIS — N809 Endometriosis, unspecified: Secondary | ICD-10-CM | POA: Diagnosis not present

## 2016-12-21 DIAGNOSIS — F419 Anxiety disorder, unspecified: Secondary | ICD-10-CM | POA: Diagnosis not present

## 2016-12-21 DIAGNOSIS — F32A Depression, unspecified: Secondary | ICD-10-CM

## 2016-12-21 DIAGNOSIS — Z86718 Personal history of other venous thrombosis and embolism: Secondary | ICD-10-CM | POA: Diagnosis not present

## 2016-12-21 LAB — POCT URINE PREGNANCY: Preg Test, Ur: POSITIVE — AB

## 2016-12-21 NOTE — Progress Notes (Signed)
GYN ENCOUNTER NOTE  Subjective:  Pregnancy confirmation visit        Cindy Wiggins is a 31 y.o. G1P0000 female is here for gynecologic evaluation of the following issues:  1. Pregnancy confirmation  LMP (uncertain)-10/05/2016 EDD-07/12/2017 EGA 11.0 weeks  Patient reports fatigue, stomach queasiness, moodiness, nausea without vomiting, and breast tenderness. She is not experiencing any significant vaginal bleeding, discharge, or pelvic pain.  Prenatal risk factors included the following:  Obesity BMI 41   Chronic hypertension; previously on valsartan; discontinue medication once UPT was positive.  History of DVT on birth control pills; subsequent stroke; treated with Coumadin less than 1 year after diagnosis  Endometriosis; status post laparoscopic biopsy confirmation; treated with Mirena IUD  Depression/anxiety    History of migraine headaches   Gynecologic History Patient's last menstrual period was 10/05/2016 (approximate). Contraception: none  Obstetric History OB History  Gravida Para Term Preterm AB Living  1 0 0 0 0 0  SAB TAB Ectopic Multiple Live Births  0 0 0 0      # Outcome Date GA Lbr Len/2nd Weight Sex Delivery Anes PTL Lv  1 Current               Past Medical History:  Diagnosis Date  . Anxiety   . Depression   . Heavy period   . Hypertension   . IBS (irritable bowel syndrome)   . Painful intercourse   . Painful menstrual periods   . TIA (transient ischemic attack)     Past Surgical History:  Procedure Laterality Date  . LAPAROSCOPY  2013  . LAPAROSCOPY N/A 02/17/2015   Procedure: LAPAROSCOPY OPERATIVE/ WITH EXCISION AND FULGERATION  of endometrosis;  Surgeon: Herold Harms, MD;  Location: [facility];  Service: Gynecology;  Laterality: N/A;  . UPPER GI ENDOSCOPY      No current outpatient prescriptions on file prior to visit.   No current facility-administered medications on file prior to visit.     Allergies  Allergen Reactions  .  Imitrex [Sumatriptan] Nausea And Vomiting  . Sumatriptan Succinate Nausea And Vomiting    Social History   Social History  . Marital status: Married    Spouse name: N/A  . Number of children: N/A  . Years of education: N/A   Occupational History  . Not on file.   Social History Main Topics  . Smoking status: Former Smoker    Packs/day: 0.00    Types: Cigarettes    Quit date: 03/10/2015  . Smokeless tobacco: Never Used  . Alcohol use 0.0 - 0.6 oz/week     Comment: occas  . Drug use: No  . Sexual activity: Yes    Birth control/ protection: None   Other Topics Concern  . Not on file   Social History Narrative  . No narrative on file    Family History  Problem Relation Age of Onset  . Colon cancer Father   . Breast cancer Paternal Aunt   . Colon cancer Paternal Grandfather   . Heart disease Neg Hx   . Ovarian cancer Neg Hx   . Diabetes Neg Hx     The following portions of the patient's history were reviewed and updated as appropriate: allergies, current medications, past family history, past medical history, past social history, past surgical history and problem list.  Review of Systems Review of Systems - Comprehensive review of systems is negative except for that noted in the history of present illness  Objective:  BP 121/84   Pulse 89   Ht  (1.651 m)   Wt 250 lb (113.4 kg)   LMP 10/05/2016 (Approximate) Comment: spotting  BMI 41.60 kg/m    Assessment:   1. Amenorrhea - POCT urine pregnancy  2. Irregular menses - US OB Comp Less 14 Wks; Future - US OB Transvaginal; Future  3. Positive urine pregnancy test  4. Morbid obesity (HCC)BMI 41.6  5. History of DVT (deep vein thrombosis), 2010, OCP related  6. Maternal chronic hypertension in first trimester, off medication; previously treated with valsartan  7. Anxiety and depression  8. History of stroke, 2010  9. Endometriosis     Plan:   1. Prenatal vitamins daily 2. Aspirin 81 mg  daily 3. Remain off valsartan at this time 4. Pelvic ultrasound-first available to confirm fetal viability and EDD 5. Return for new OB nursing intake and new OB history and physical per protocol 6.New OB counseling: The patient has been given an overview regarding routine prenatal care. Recommendations regarding diet, weight gain, and exercise in pregnancy were given. Prenatal testing, optional genetic testing, and ultrasound use in pregnancy were reviewed.  Benefits of Breast Feeding were discussed. The patient is encouraged to consider nursing her baby post partum. 7. Patient may require DVT prophylaxis during pregnancy due to history of OCP related DVT/stroke.  A total of 25 minutes were spent face-to-face with the patient during this encounter and over half of that time involved counseling and coordination of care.  Herold Harms, MD  Note: This dictation was prepared with Dragon dictation along with smaller phrase technology. Any transcriptional errors that result from this process are unintentional.

## 2016-12-21 NOTE — Patient Instructions (Addendum)
1. Ultrasound is scheduled to verify EDC-first available appointment 2. New OB nursing appointment will be determined based on gestational age at ultrasound 3. Prenatal vitamins daily 4. Baby aspirin 81 mg a day recommended   Morning Sickness Morning sickness is when you feel sick to your stomach (nauseous) during pregnancy. You may feel sick to your stomach and throw up (vomit). You may feel sick in the morning, but you can feel this way any time of day. Some women feel very sick to their stomach and cannot stop throwing up (hyperemesis gravidarum). Follow these instructions at home:  Only take medicines as told by your doctor.  Take multivitamins as told by your doctor. Taking multivitamins before getting pregnant can stop or lessen the harshness of morning sickness.  Eat dry toast or unsalted crackers before getting out of bed.  Eat 5 to 6 small meals a day.  Eat dry and bland foods like rice and baked potatoes.  Do not drink liquids with meals. Drink between meals.  Do not eat greasy, fatty, or spicy foods.  Have someone cook for you if the smell of food causes you to feel sick or throw up.  If you feel sick to your stomach after taking prenatal vitamins, take them at night or with a snack.  Eat protein when you need a snack (nuts, yogurt, cheese).  Eat unsweetened gelatins for dessert.  Wear a bracelet used for sea sickness (acupressure wristband).  Go to a doctor that puts thin needles into certain body points (acupuncture) to improve how you feel.  Do not smoke.  Use a humidifier to keep the air in your house free of odors.  Get lots of fresh air. Contact a doctor if:  You need medicine to feel better.  You feel dizzy or lightheaded.  You are losing weight. Get help right away if:  You feel very sick to your stomach and cannot stop throwing up.  You pass out (faint). This information is not intended to replace advice given to you by your health care  provider. Make sure you discuss any questions you have with your health care provider. Document Released: 04/29/2004 Document Revised: 08/28/2015 Document Reviewed: 09/06/2012 Elsevier Interactive Patient Education  2017 [employer]. How a Baby Grows During Pregnancy Pregnancy begins when a female's sperm enters a female's egg (fertilization). This happens in one of the tubes (fallopian tubes) that connect the ovaries to the womb (uterus). The fertilized egg is called an embryo until it reaches 10 weeks. From 10 weeks until birth, it is called a fetus. The fertilized egg moves down the fallopian tube to the uterus. Then it implants into the lining of the uterus and begins to grow. The developing fetus receives oxygen and nutrients through the pregnant woman's bloodstream and the tissues that grow (placenta) to support the fetus. The placenta is the life support system for the fetus. It provides nutrition and removes waste. Learning as much as you can about your pregnancy and how your baby is developing can help you enjoy the experience. It can also make you aware of when there might be a problem and when to ask questions. How long does a typical pregnancy last? A pregnancy usually lasts 280 days, or about 40 weeks. Pregnancy is divided into three trimesters:  First trimester: 0-13 weeks.  Second trimester: 14-27 weeks.  Third trimester: 28-40 weeks.  The day when your baby is considered ready to be born (full term) is your estimated date of delivery. How does  my baby develop month by month? First month  The fertilized egg attaches to the inside of the uterus.  Some cells will form the placenta. Others will form the fetus.  The arms, legs, brain, spinal cord, lungs, and heart begin to develop.  At the end of the first month, the heart begins to beat.  Second month  The bones, inner ear, eyelids, hands, and feet form.  The genitals develop.  By the end of 8 weeks, all major organs are  developing.  Third month  All of the internal organs are forming.  Teeth develop below the gums.  Bones and muscles begin to grow. The spine can flex.  The skin is transparent.  Fingernails and toenails begin to form.  Arms and legs continue to grow longer, and hands and feet develop.  The fetus is about 3 in (7.6 cm) long.  Fourth month  The placenta is completely formed.  The external sex organs, neck, outer ear, eyebrows, eyelids, and fingernails are formed.  The fetus can hear, swallow, and move its arms and legs.  The kidneys begin to produce urine.  The skin is covered with a white waxy coating (vernix) and very fine hair (lanugo).  Fifth month  The fetus moves around more and can be felt for the first time (quickening).  The fetus starts to sleep and wake up and may begin to suck its finger.  The nails grow to the end of the fingers.  The organ in the digestive system that makes bile (gallbladder) functions and helps to digest the nutrients.  If your baby is a girl, eggs are present in her ovaries. If your baby is a boy, testicles start to move down into his scrotum.  Sixth month  The lungs are formed, but the fetus is not yet able to breathe.  The eyes open. The brain continues to develop.  Your baby has fingerprints and toe prints. Your baby's hair grows thicker.  At the end of the second trimester, the fetus is about 9 in (22.9 cm) long.  Seventh month  The fetus kicks and stretches.  The eyes are developed enough to sense changes in light.  The hands can make a grasping motion.  The fetus responds to sound.  Eighth month  All organs and body systems are fully developed and functioning.  Bones harden and taste buds develop. The fetus may hiccup.  Certain areas of the brain are still developing. The skull remains soft.  Ninth month  The fetus gains about  lb (0.23 kg) each week.  The lungs are fully developed.  Patterns of sleep  develop.  The fetus's head typically moves into a head-down position (vertex) in the uterus to prepare for birth. If the buttocks move into a vertex position instead, the baby is breech.  The fetus weighs 6-9 lbs (2.72-4.08 kg) and is 19-20 in (48.26-50.8 cm) long.  What can I do to have a healthy pregnancy and help my baby develop? Eating and Drinking  Eat a healthy diet. ? Talk with your health care provider to make sure that you are getting the nutrients that you and your baby need. ? Visit www.BuildDNA.es to learn about creating a healthy diet.  Gain a healthy amount of weight during pregnancy as advised by your health care provider. This is usually 25-35 pounds. You may need to: ? Gain more if you were underweight before getting pregnant or if you are pregnant with more than one baby. ? Gain less  if you were overweight or obese when you got pregnant.  Medicines and Vitamins  Take prenatal vitamins as directed by your health care provider. These include vitamins such as folic acid, iron, calcium, and vitamin D. They are important for healthy development.  Take medicines only as directed by your health care provider. Read labels and ask a pharmacist or your health care provider whether over-the-counter medicines, supplements, and prescription drugs are safe to take during pregnancy.  Activities  Be physically active as advised by your health care provider. Ask your health care provider to recommend activities that are safe for you to do, such as walking or swimming.  Do not participate in strenuous or extreme sports.  Lifestyle  Do not drink alcohol.  Do not use any tobacco products, including cigarettes, chewing tobacco, or electronic cigarettes. If you need help quitting, ask your health care provider.  Do not use illegal drugs.  Safety  Avoid exposure to mercury, lead, or other heavy metals. Ask your health care provider about common sources of these heavy  metals.  Avoid listeria infection during pregnancy. Follow these precautions: ? Do not eat soft cheeses or deli meats. ? Do not eat hot dogs unless they have been warmed up to the point of steaming, such as in the microwave oven. ? Do not drink unpasteurized milk.  Avoid toxoplasmosis infection during pregnancy. Follow these precautions: ? Do not change your cat's litter box, if you have a cat. Ask someone else to do this for you. ? Wear gardening gloves while working in the yard.  General Instructions  Keep all follow-up visits as directed by your health care provider. This is important. This includes prenatal care and screening tests.  Manage any chronic health conditions. Work closely with your health care provider to keep conditions, such as diabetes, under control.  How do I know if my baby is developing well? At each prenatal visit, your health care provider will do several different tests to check on your health and keep track of your baby's development. These include:  Fundal height. ? Your health care provider will measure your growing belly from top to bottom using a tape measure. ? Your health care provider will also feel your belly to determine your baby's position.  Heartbeat. ? An ultrasound in the first trimester can confirm pregnancy and show a heartbeat, depending on how far along you are. ? Your health care provider will check your baby's heart rate at every prenatal visit. ? As you get closer to your delivery date, you may have regular fetal heart rate monitoring to make sure that your baby is not in distress.  Second trimester ultrasound. ? This ultrasound checks your baby's development. It also indicates your baby's gender.  What should I do if I have concerns about my baby's development? Always talk with your health care provider about any concerns that you may have. This information is not intended to replace advice given to you by your health care provider.  Make sure you discuss any questions you have with your health care provider. Document Released: 09/08/2007 Document Revised: 08/28/2015 Document Reviewed: 08/29/2013 Elsevier Interactive Patient Education  2018 [employer] of Pregnancy The first trimester of pregnancy is from week 1 until the end of week 13 (months 1 through 3). A week after a sperm fertilizes an egg, the egg will implant on the wall of the uterus. This embryo will begin to develop into a baby. Genes from you and your partner  will form the baby. The female genes will determine whether the baby will be a boy or a girl. At 6-8 weeks, the eyes and face will be formed, and the heartbeat can be seen on ultrasound. At the end of 12 weeks, all the baby's organs will be formed. Now that you are pregnant, you will want to do everything you can to have a healthy baby. Two of the most important things are to get good prenatal care and to follow your health care provider's instructions. Prenatal care is all the medical care you receive before the baby's birth. This care will help prevent, find, and treat any problems during the pregnancy and childbirth. Body changes during your first trimester Your body goes through many changes during pregnancy. The changes vary from woman to woman.  You may gain or lose a couple of pounds at first.  You may feel sick to your stomach (nauseous) and you may throw up (vomit). If the vomiting is uncontrollable, call your health care provider.  You may tire easily.  You may develop headaches that can be relieved by medicines. All medicines should be approved by your health care provider.  You may urinate more often. Painful urination may mean you have a bladder infection.  You may develop heartburn as a result of your pregnancy.  You may develop constipation because certain hormones are causing the muscles that push stool through your intestines to slow down.  You may develop hemorrhoids or  swollen veins (varicose veins).  Your breasts may begin to grow larger and become tender. Your nipples may stick out more, and the tissue that surrounds them (areola) may become darker.  Your gums may bleed and may be sensitive to brushing and flossing.  Dark spots or blotches (chloasma, mask of pregnancy) may develop on your face. This will likely fade after the baby is born.  Your menstrual periods will stop.  You may have a loss of appetite.  You may develop cravings for certain kinds of food.  You may have changes in your emotions from day to day, such as being excited to be pregnant or being concerned that something may go wrong with the pregnancy and baby.  You may have more vivid and strange dreams.  You may have changes in your hair. These can include thickening of your hair, rapid growth, and changes in texture. Some women also have hair loss during or after pregnancy, or hair that feels dry or thin. Your hair will most likely return to normal after your baby is born.  What to expect at prenatal visits During a routine prenatal visit:  You will be weighed to make sure you and the baby are growing normally.  Your blood pressure will be taken.  Your abdomen will be measured to track your baby's growth.  The fetal heartbeat will be listened to between weeks 10 and 14 of your pregnancy.  Test results from any previous visits will be discussed.  Your health care provider may ask you:  How you are feeling.  If you are feeling the baby move.  If you have had any abnormal symptoms, such as leaking fluid, bleeding, severe headaches, or abdominal cramping.  If you are using any tobacco products, including cigarettes, chewing tobacco, and electronic cigarettes.  If you have any questions.  Other tests that may be performed during your first trimester include:  Blood tests to find your blood type and to check for the presence of any previous infections. The tests  will also  be used to check for low iron levels (anemia) and protein on red blood cells (Rh antibodies). Depending on your risk factors, or if you previously had diabetes during pregnancy, you may have tests to check for high blood sugar that affects pregnant women (gestational diabetes).  Urine tests to check for infections, diabetes, or protein in the urine.  An ultrasound to confirm the proper growth and development of the baby.  Fetal screens for spinal cord problems (spina bifida) and Down syndrome.  HIV (human immunodeficiency virus) testing. Routine prenatal testing includes screening for HIV, unless you choose not to have this test.  You may need other tests to make sure you and the baby are doing well.  Follow these instructions at home: Medicines  Follow your health care provider's instructions regarding medicine use. Specific medicines may be either safe or unsafe to take during pregnancy.  Take a prenatal vitamin that contains at least 600 micrograms (mcg) of folic acid.  If you develop constipation, try taking a stool softener if your health care provider approves. Eating and drinking  Eat a balanced diet that includes fresh fruits and vegetables, whole grains, good sources of protein such as meat, eggs, or tofu, and low-fat dairy. Your health care provider will help you determine the amount of weight gain that is right for you.  Avoid raw meat and uncooked cheese. These carry germs that can cause birth defects in the baby.  Eating four or five small meals rather than three large meals a day may help relieve nausea and vomiting. If you start to feel nauseous, eating a few soda crackers can be helpful. Drinking liquids between meals, instead of during meals, also seems to help ease nausea and vomiting.  Limit foods that are high in fat and processed sugars, such as fried and sweet foods.  To prevent constipation: ? Eat foods that are high in fiber, such as fresh fruits and vegetables,  whole grains, and beans. ? Drink enough fluid to keep your urine clear or pale yellow. Activity  Exercise only as directed by your health care provider. Most women can continue their usual exercise routine during pregnancy. Try to exercise for 30 minutes at least 5 days a week. Exercising will help you: ? Control your weight. ? Stay in shape. ? Be prepared for labor and delivery.  Experiencing pain or cramping in the lower abdomen or lower back is a good sign that you should stop exercising. Check with your health care provider before continuing with normal exercises.  Try to avoid standing for long periods of time. Move your legs often if you must stand in one place for a long time.  Avoid heavy lifting.  Wear low-heeled shoes and practice good posture.  You may continue to have sex unless your health care provider tells you not to. Relieving pain and discomfort  Wear a good support bra to relieve breast tenderness.  Take warm sitz baths to soothe any pain or discomfort caused by hemorrhoids. Use hemorrhoid cream if your health care provider approves.  Rest with your legs elevated if you have leg cramps or low back pain.  If you develop varicose veins in your legs, wear support hose. Elevate your feet for 15 minutes, 3-4 times a day. Limit salt in your diet. Prenatal care  Schedule your prenatal visits by the twelfth week of pregnancy. They are usually scheduled monthly at first, then more often in the last 2 months before delivery.  Write down  your questions. Take them to your prenatal visits.  Keep all your prenatal visits as told by your health care provider. This is important. Safety  Wear your seat belt at all times when driving.  Make a list of emergency phone numbers, including numbers for family, friends, the hospital, and police and fire departments. General instructions  Ask your health care provider for a referral to a local prenatal education class. Begin classes  no later than the beginning of month 6 of your pregnancy.  Ask for help if you have counseling or nutritional needs during pregnancy. Your health care provider can offer advice or refer you to specialists for help with various needs.  Do not use hot tubs, steam rooms, or saunas.  Do not douche or use tampons or scented sanitary pads.  Do not cross your legs for long periods of time.  Avoid cat litter boxes and soil used by cats. These carry germs that can cause birth defects in the baby and possibly loss of the fetus by miscarriage or stillbirth.  Avoid all smoking, herbs, alcohol, and medicines not prescribed by your health care provider. Chemicals in these products affect the formation and growth of the baby.  Do not use any products that contain nicotine or tobacco, such as cigarettes and e-cigarettes. If you need help quitting, ask your health care provider. You may receive counseling support and other resources to help you quit.  Schedule a dentist appointment. At home, brush your teeth with a soft toothbrush and be gentle when you floss. Contact a health care provider if:  You have dizziness.  You have mild pelvic cramps, pelvic pressure, or nagging pain in the abdominal area.  You have persistent nausea, vomiting, or diarrhea.  You have a bad smelling vaginal discharge.  You have pain when you urinate.  You notice increased swelling in your face, hands, legs, or ankles.  You are exposed to fifth disease or chickenpox.  You are exposed to [place] measles (rubella) and have never had it. Get help right away if:  You have a fever.  You are leaking fluid from your vagina.  You have spotting or bleeding from your vagina.  You have severe abdominal cramping or pain.  You have rapid weight gain or loss.  You vomit blood or material that looks like coffee grounds.  You develop a severe headache.  You have shortness of breath.  You have any kind of trauma, such as  from a fall or a car accident. Summary  The first trimester of pregnancy is from week 1 until the end of week 13 (months 1 through 3).  Your body goes through many changes during pregnancy. The changes vary from woman to woman.  You will have routine prenatal visits. During those visits, your health care provider will examine you, discuss any test results you may have, and talk with you about how you are feeling. This information is not intended to replace advice given to you by your health care provider. Make sure you discuss any questions you have with your health care provider. Document Released: 03/16/2001 Document Revised: 03/03/2016 Document Reviewed: 03/03/2016 Elsevier Interactive Patient Education  2017 [employer]. Commonly Asked Questions During Pregnancy  Cats: A parasite can be excreted in cat feces.  To avoid exposure you need to have another person empty the little box.  If you must empty the litter box you will need to wear gloves.  Wash your hands after handling your cat.  This parasite can  also be found in raw or undercooked meat so this should also be avoided.  Colds, Sore Throats, Flu: Please check your medication sheet to see what you can take for symptoms.  If your symptoms are unrelieved by these medications please call the office.  Dental Work: Most any dental work [employer] recommends is permitted.  X-rays should only be taken during the first trimester if absolutely necessary.  Your abdomen should be shielded with a lead apron during all x-rays.  Please notify your provider prior to receiving any x-rays.  Novocaine is fine; gas is not recommended.  If your dentist requires a note from [place] prior to dental work please call the office and we will provide one for you.  Exercise: Exercise is an important part of staying healthy during your pregnancy.  You may continue most exercises you were accustomed to prior to pregnancy.  Later in your pregnancy you will most likely  notice you have difficulty with activities requiring balance like riding a bicycle.  It is important that you listen to your body and avoid activities that put you at a higher risk of falling.  Adequate rest and staying well hydrated are a must!  If you have questions about the safety of specific activities ask your provider.    Exposure to Children with illness: Try to avoid obvious exposure; report any symptoms to [place] when noted,  If you have chicken pos, red measles or mumps, you should be immune to these diseases.   Please do not take any vaccines while pregnant unless you have checked with your OB provider.  Fetal Movement: After 28 weeks we recommend you do "kick counts" twice daily.  Lie or sit down in a calm quiet environment and count your baby movements "kicks".  You should feel your baby at least 10 times per hour.  If you have not felt 10 kicks within the first hour get up, walk around and have something sweet to eat or drink then repeat for an additional hour.  If count remains less than 10 per hour notify your provider.  Fumigating: Follow your pest control agent's advice as to how long to stay out of your home.  Ventilate the area well before re-entering.  Hemorrhoids:   Most over-the-counter preparations can be used during pregnancy.  Check your medication to see what is safe to use.  It is important to use a stool softener or fiber in your diet and to drink lots of liquids.  If hemorrhoids seem to be getting worse please call the office.   Hot Tubs:  Hot tubs Jacuzzis and saunas are not recommended while pregnant.  These increase your internal body temperature and should be avoided.  Intercourse:  Sexual intercourse is safe during pregnancy as long as you are comfortable, unless otherwise advised by your provider.  Spotting may occur after intercourse; report any bright red bleeding that is heavier than spotting.  Labor:  If you know that you are in labor, please go to the hospital.  If  you are unsure, please call the office and let us help you decide what to do.  Lifting, straining, etc:  If your job requires heavy lifting or straining please check with your provider for any limitations.  Generally, you should not lift items heavier than that you can lift simply with your hands and arms (no back muscles)  Painting:  Paint fumes do not harm your pregnancy, but may make you ill and should be avoided if possible.  Latex or water based paints have less odor than oils.  Use adequate ventilation while painting.  Permanents & Hair Color:  Chemicals in hair dyes are not recommended as they cause increase hair dryness which can increase hair loss during pregnancy.  " Highlighting" and permanents are allowed.  Dye may be absorbed differently and permanents may not hold as well during pregnancy.  Sunbathing:  Use a sunscreen, as skin burns easily during pregnancy.  Drink plenty of fluids; avoid over heating.  Tanning Beds:  Because their possible side effects are still unknown, tanning beds are not recommended.  Ultrasound Scans:  Routine ultrasounds are performed at approximately 20 weeks.  You will be able to see your baby's general anatomy an if you would like to know the gender this can usually be determined as well.  If it is questionable when you conceived you may also receive an ultrasound early in your pregnancy for dating purposes.  Otherwise ultrasound exams are not routinely performed unless there is a medical necessity.  Although you can request a scan we ask that you pay for it when conducted because insurance does not cover " patient request" scans.  Work: If your pregnancy proceeds without complications you may work until your due date, unless your physician or employer advises otherwise.  Round Ligament Pain/Pelvic Discomfort:  Sharp, shooting pains not associated with bleeding are fairly common, usually occurring in the second trimester of pregnancy.  They tend to be worse  when standing up or when you remain standing for long periods of time.  These are the result of pressure of certain pelvic ligaments called "round ligaments".  Rest, Tylenol and heat seem to be the most effective relief.  As the womb and fetus grow, they rise out of the pelvis and the discomfort improves.  Please notify the office if your pain seems different than that described.  It may represent a more serious condition.  Common Medications Safe in Pregnancy  Acne:      Constipation:  Benzoyl Peroxide     Colace  Clindamycin      Dulcolax Suppository  Topica Erythromycin     Fibercon  Salicylic Acid      Metamucil         Miralax AVOID:        Senakot   Accutane    Cough:  Retin-A       Cough Drops  Tetracycline      Phenergan w/ Codeine if Rx  Minocycline      Robitussin (Plain & DM)  Antibiotics:     Crabs/Lice:  Ceclor       RID  Cephalosporins    AVOID:  E-Mycins      Kwell  Keflex  Macrobid/Macrodantin   Diarrhea:  Penicillin      Kao-Pectate  Zithromax      Imodium AD         PUSH FLUIDS AVOID:       Cipro     Fever:  Tetracycline      Tylenol (Regular or Extra  Minocycline       Strength)  Levaquin      Extra Strength-Do not          Exceed 8 tabs/24 hrs Caffeine:        <223m/day (equiv. To 1 cup of coffee or  approx. 3 12 oz sodas)         Gas: Cold/Hayfever:       Gas-X  Benadryl  Mylicon  Claritin       Phazyme  **Claritin-D        Chlor-Trimeton    Headaches:  Dimetapp      ASA-Free Excedrin  Drixoral-Non-Drowsy     Cold Compress  Mucinex (Guaifenasin)     Tylenol (Regular or Extra  Sudafed/Sudafed-12 Hour     Strength)  **Sudafed PE Pseudoephedrine   Tylenol Cold & Sinus     Vicks Vapor Rub  Zyrtec  **AVOID if Problems With Blood Pressure         Heartburn: Avoid lying down for at least 1 hour after meals  Aciphex      Maalox     Rash:  Milk of Magnesia     Benadryl    Mylanta       1% Hydrocortisone Cream  Pepcid  Pepcid  Complete   Sleep Aids:  Prevacid      Ambien   Prilosec       Benadryl  Rolaids       Chamomile Tea  Tums (Limit 4/day)     Unisom  Zantac       Tylenol PM         Warm milk-add vanilla or  Hemorrhoids:       Sugar for taste  Anusol/Anusol H.C.  (RX: Analapram 2.5%)  Sugar Substitutes:  Hydrocortisone OTC     Ok in moderation  Preparation H      Tucks        Vaseline lotion applied to tissue with wiping    Herpes:     Throat:  Acyclovir      Oragel  Famvir  Valtrex     Vaccines:         Flu Shot Leg Cramps:       *Gardasil  Benadryl      Hepatitis A         Hepatitis B Nasal Spray:       Pneumovax  Saline Nasal Spray     Polio Booster         Tetanus Nausea:       Tuberculosis test or PPD  Vitamin B6 25 mg TID   AVOID:    Dramamine      *Gardasil  Emetrol       Live Poliovirus  Ginger Root 250 mg QID    MMR (measles, mumps &  High Complex Carbs @ Bedtime    rebella)  Sea Bands-Accupressure    Varicella (Chickenpox)  Unisom 1/2 tab TID     *No known complications           If received before Pain:         Known pregnancy;   Darvocet       Resume series after  Lortab        Delivery  Percocet    Yeast:   Tramadol      Femstat  Tylenol 3      Gyne-lotrimin  Ultram       Monistat  Vicodin           MISC:         All Sunscreens           Hair Coloring/highlights          Insect Repellant's          (Including DEET)         Mystic Tans

## 2017-01-11 ENCOUNTER — Ambulatory Visit (INDEPENDENT_AMBULATORY_CARE_PROVIDER_SITE_OTHER): Payer: BLUE CROSS/BLUE SHIELD | Admitting: Obstetrics and Gynecology

## 2017-01-11 VITALS — BP 112/78 | HR 77 | Ht 65.0 in | Wt 250.5 lb

## 2017-01-11 DIAGNOSIS — Z3491 Encounter for supervision of normal pregnancy, unspecified, first trimester: Secondary | ICD-10-CM

## 2017-01-11 NOTE — Patient Instructions (Signed)
First Trimester of Pregnancy The first trimester of pregnancy is from week 1 until the end of week 13 (months 1 through 3). During this time, your baby will begin to develop inside you. At 6-8 weeks, the eyes and face are formed, and the heartbeat can be seen on ultrasound. At the end of 12 weeks, all the baby's organs are formed. Prenatal care is all the medical care you receive before the birth of your baby. Make sure you get good prenatal care and follow all of your doctor's instructions. Follow these instructions at home: Medicines  Take over-the-counter and prescription medicines only as told by your doctor. Some medicines are safe and some medicines are not safe during pregnancy.  Take a prenatal vitamin that contains at least 600 micrograms (mcg) of folic acid.  If you have trouble pooping (constipation), take medicine that will make your stool soft (stool softener) if your doctor approves. Eating and drinking  Eat regular, healthy meals.  Your doctor will tell you the amount of weight gain that is right for you.  Avoid raw meat and uncooked cheese.  If you feel sick to your stomach (nauseous) or throw up (vomit): ? Eat 4 or 5 small meals a day instead of 3 large meals. ? Try eating a few soda crackers. ? Drink liquids between meals instead of during meals.  To prevent constipation: ? Eat foods that are high in fiber, like fresh fruits and vegetables, whole grains, and beans. ? Drink enough fluids to keep your pee (urine) clear or pale yellow. Activity  Exercise only as told by your doctor. Stop exercising if you have cramps or pain in your lower belly (abdomen) or low back.  Do not exercise if it is too hot, too humid, or if you are in a place of great height (high altitude).  Try to avoid standing for long periods of time. Move your legs often if you must stand in one place for a long time.  Avoid heavy lifting.  Wear low-heeled shoes. Sit and stand up straight.  You  can have sex unless your doctor tells you not to. Relieving pain and discomfort  Wear a good support bra if your breasts are sore.  Take warm water baths (sitz baths) to soothe pain or discomfort caused by hemorrhoids. Use hemorrhoid cream if your doctor says it is okay.  Rest with your legs raised if you have leg cramps or low back pain.  If you have puffy, bulging veins (varicose veins) in your legs: ? Wear support hose or compression stockings as told by your doctor. ? Raise (elevate) your feet for 15 minutes, 3-4 times a day. ? Limit salt in your food. Prenatal care  Schedule your prenatal visits by the twelfth week of pregnancy.  Write down your questions. Take them to your prenatal visits.  Keep all your prenatal visits as told by your doctor. This is important. Safety  Wear your seat belt at all times when driving.  Make a list of emergency phone numbers. The list should include numbers for family, friends, the hospital, and police and fire departments. General instructions  Ask your doctor for a referral to a local prenatal class. Begin classes no later than at the start of month 6 of your pregnancy.  Ask for help if you need counseling or if you need help with nutrition. Your doctor can give you advice or tell you where to go for help.  Do not use hot tubs, steam rooms, or   saunas.  Do not douche or use tampons or scented sanitary pads.  Do not cross your legs for long periods of time.  Avoid all herbs and alcohol. Avoid drugs that are not approved by your doctor.  Do not use any tobacco products, including cigarettes, chewing tobacco, and electronic cigarettes. If you need help quitting, ask your doctor. You may get counseling or other support to help you quit.  Avoid cat litter boxes and soil used by cats. These carry germs that can cause birth defects in the baby and can cause a loss of your baby (miscarriage) or stillbirth.  Visit your dentist. At home, brush  your teeth with a soft toothbrush. Be gentle when you floss. Contact a doctor if:  You are dizzy.  You have mild cramps or pressure in your lower belly.  You have a nagging pain in your belly area.  You continue to feel sick to your stomach, you throw up, or you have watery poop (diarrhea).  You have a bad smelling fluid coming from your vagina.  You have pain when you pee (urinate).  You have increased puffiness (swelling) in your face, hands, legs, or ankles. Get help right away if:  You have a fever.  You are leaking fluid from your vagina.  You have spotting or bleeding from your vagina.  You have very bad belly cramping or pain.  You gain or lose weight rapidly.  You throw up blood. It may look like coffee grounds.  You are around people who have German measles, fifth disease, or chickenpox.  You have a very bad headache.  You have shortness of breath.  You have any kind of trauma, such as from a fall or a car accident. Summary  The first trimester of pregnancy is from week 1 until the end of week 13 (months 1 through 3).  To take care of yourself and your unborn baby, you will need to eat healthy meals, take medicines only if your doctor tells you to do so, and do activities that are safe for you and your baby.  Keep all follow-up visits as told by your doctor. This is important as your doctor will have to ensure that your baby is healthy and growing well. This information is not intended to replace advice given to you by your health care provider. Make sure you discuss any questions you have with your health care provider. Document Released: 09/08/2007 Document Revised: 03/30/2016 Document Reviewed: 03/30/2016 Elsevier Interactive Patient Education  2017 [employer]  

## 2017-01-11 NOTE — Progress Notes (Signed)
Cindy Wiggins presents for NOB nurse interview visit. Pregnancy confirmation done here at [facility].   G-1 .  P- . Pregnancy education material explained and given. _No__ cats in the home. NOB labs ordered. (TSH/HbgA1c due to Increased BMI),. HIV labs and . Drug screen ordered. PNV encouraged. Genetic screening options discussed. Genetic testing: Cindy Wiggins.  Pt may discuss with provider. Pt. To follow up with provider in _2_ weeks for NOB physical.  All questions answered.

## 2017-01-12 LAB — "ABO AND RH ": Rh Factor: POSITIVE

## 2017-01-12 LAB — URINE CULTURE

## 2017-01-12 LAB — CBC WITH DIFFERENTIAL/PLATELET
Basophils Absolute: 0 10*3/uL (ref 0.0–0.2)
Basos: 0 %
EOS (ABSOLUTE): 0.1 10*3/uL (ref 0.0–0.4)
EOS: 1 %
HEMATOCRIT: 40.2 % (ref 34.0–46.6)
HEMOGLOBIN: 14.1 g/dL (ref 11.1–15.9)
IMMATURE GRANULOCYTES: 0 %
Immature Grans (Abs): 0 10*3/uL (ref 0.0–0.1)
Lymphocytes Absolute: 2 10*3/uL (ref 0.7–3.1)
Lymphs: 22 %
MCH: 29.4 pg (ref 26.6–33.0)
MCHC: 35.1 g/dL (ref 31.5–35.7)
MCV: 84 fL (ref 79–97)
MONOCYTES: 8 %
Monocytes Absolute: 0.7 10*3/uL (ref 0.1–0.9)
NEUTROS PCT: 69 %
Neutrophils Absolute: 6.3 10*3/uL (ref 1.4–7.0)
Platelets: 179 10*3/uL (ref 150–379)
RBC: 4.8 x10E6/uL (ref 3.77–5.28)
RDW: 12.5 % (ref 12.3–15.4)
WBC: 9.1 10*3/uL (ref 3.4–10.8)

## 2017-01-12 LAB — HEMOGLOBIN A1C
Est. average glucose Bld gHb Est-mCnc: 100 mg/dL
Hgb A1c MFr Bld: 5.1 % (ref 4.8–5.6)

## 2017-01-12 LAB — HIV ANTIBODY (ROUTINE TESTING W REFLEX): HIV Screen 4th Generation wRfx: NONREACTIVE

## 2017-01-12 LAB — TSH: TSH: 0.695 u[IU]/mL (ref 0.450–4.500)

## 2017-01-12 LAB — VARICELLA ZOSTER ANTIBODY, IGG: Varicella zoster IgG: 1473 {index}

## 2017-01-12 LAB — SYPHILIS: RPR W/REFLEX TO RPR TITER AND TREPONEMAL ANTIBODIES, TRADITIONAL SCREENING AND DIAGNOSIS ALGORITHM: RPR Ser Ql: NONREACTIVE

## 2017-01-12 LAB — ANTIBODY SCREEN: Antibody Screen: NEGATIVE

## 2017-01-12 LAB — RUBELLA SCREEN: Rubella Antibodies, IGG: 2.04 {index}

## 2017-01-12 NOTE — Progress Notes (Signed)
I have reviewed the record and concur with patient management and plan.  Patient's problem list and history reviewed. Will require MD management.   Hildred Laser, MD [facility]

## 2017-01-25 ENCOUNTER — Encounter: Payer: Self-pay | Admitting: Obstetrics and Gynecology

## 2017-01-25 ENCOUNTER — Ambulatory Visit (INDEPENDENT_AMBULATORY_CARE_PROVIDER_SITE_OTHER): Payer: BLUE CROSS/BLUE SHIELD | Admitting: Obstetrics and Gynecology

## 2017-01-25 VITALS — BP 111/76 | HR 86 | Wt 252.4 lb

## 2017-01-25 DIAGNOSIS — Z86718 Personal history of other venous thrombosis and embolism: Secondary | ICD-10-CM

## 2017-01-25 DIAGNOSIS — Z3491 Encounter for supervision of normal pregnancy, unspecified, first trimester: Secondary | ICD-10-CM

## 2017-01-25 LAB — POCT URINALYSIS DIPSTICK
BILIRUBIN UA: NEGATIVE
Glucose, UA: NEGATIVE
Ketones, UA: NEGATIVE
LEUKOCYTES UA: NEGATIVE
Nitrite, UA: NEGATIVE
PH UA: 7 (ref 5.0–8.0)
Protein, UA: NEGATIVE
RBC UA: NEGATIVE
Spec Grav, UA: 1.01 (ref 1.010–1.025)
UROBILINOGEN UA: 0.2 U/dL

## 2017-01-25 NOTE — Addendum Note (Signed)
Addended by: Brooke DareSICK, Lavonda Thal L on: 01/25/2017 11:54 AM   Modules accepted: Orders

## 2017-01-25 NOTE — Progress Notes (Signed)
HPI:      Ms. Theodora BlowMegan W Glantz is a 31 y.o. G1P0000 who LMP was Patient's last menstrual period was 11/09/2016.  Subjective:   She presents today at approximately 11 weeks for her new OB physical.  She is occasionally taking her prenatal vitamins but has some nausea without vomiting.  She is taking liquids without issue. She had her partner have not decided on genetic testing or AFP yet. She has a history of DVT and stroke -currently taking 81 mg of aspirin daily.    Hx: The following portions of the patient's history were reviewed and updated as appropriate:             She  has a past medical history of Anxiety; Depression; Heavy period; Hypertension; IBS (irritable bowel syndrome); Painful intercourse; Painful menstrual periods; and TIA (transient ischemic attack). She  does not have any pertinent problems on file. She  has a past surgical history that includes laparoscopy (2013); laparoscopy (N/A, 02/17/2015); and Upper gi endoscopy. Her family history includes Breast cancer in her paternal aunt; Colon cancer in her father and paternal grandfather. She  reports that she quit smoking about 22 months ago. Her smoking use included Cigarettes. She smoked 0.00 packs per day. She has never used smokeless tobacco. She reports that she drinks alcohol. She reports that she does not use drugs. She is allergic to imitrex [sumatriptan] and sumatriptan succinate.       Review of Systems:  Review of Systems  Constitutional: Denied constitutional symptoms, night sweats, recent illness, fatigue, fever, insomnia and weight loss.  Eyes: Denied eye symptoms, eye pain, photophobia, vision change and visual disturbance.  Ears/Nose/Throat/Neck: Denied ear, nose, throat or neck symptoms, hearing loss, nasal discharge, sinus congestion and sore throat.  Cardiovascular: Denied cardiovascular symptoms, arrhythmia, chest pain/pressure, edema, exercise intolerance, orthopnea and palpitations.  Respiratory: Denied  pulmonary symptoms, asthma, pleuritic pain, productive sputum, cough, dyspnea and wheezing.  Gastrointestinal: Denied, gastro-esophageal reflux, melena, nausea and vomiting.  Genitourinary: Denied genitourinary symptoms including symptomatic vaginal discharge, pelvic relaxation issues, and urinary complaints.  Musculoskeletal: Denied musculoskeletal symptoms, stiffness, swelling, muscle weakness and myalgia.  Dermatologic: Denied dermatology symptoms, rash and scar.  Neurologic: Denied neurology symptoms, dizziness, headache, neck pain and syncope.  Psychiatric: Denied psychiatric symptoms, anxiety and depression.  Endocrine: Denied endocrine symptoms including hot flashes and night sweats.   Meds:   Current Outpatient Prescriptions on File Prior to Visit  Medication Sig Dispense Refill  . Prenatal Vit-Fe Fumarate-FA (PRENATAL MULTIVITAMIN) TABS tablet Take 1 tablet by mouth daily at 12 noon.     No current facility-administered medications on file prior to visit.     Objective:     Vitals:   01/25/17 1105  BP: 111/76  Pulse: 86              Physical examination General NAD, Conversant  HEENT Atraumatic; Op clear with mmm.  Normo-cephalic. Pupils reactive. Anicteric sclerae  Thyroid/Neck Smooth without nodularity or enlargement. Normal ROM.  Neck Supple.  Skin No rashes, lesions or ulceration. Normal palpated skin turgor. No nodularity.  Breasts: No masses or discharge.  Symmetric.  No axillary adenopathy.  Lungs: Clear to auscultation.No rales or wheezes. Normal Respiratory effort, no retractions.  Heart: NSR.  No murmurs or rubs appreciated. No periferal edema  Abdomen: Soft.  Non-tender.  No masses.  No HSM. No hernia  Extremities: Moves all appropriately.  Normal ROM for age. No lymphadenopathy.  Neuro: Oriented to PPT.  Normal mood. Normal affect.  Pelvic:   Vulva: Normal appearance.  No lesions.  Vagina: No lesions or abnormalities noted.  Support: Normal pelvic  support.  Urethra No masses tenderness or scarring.  Meatus Normal size without lesions or prolapse.  Cervix: Normal appearance.  No lesions.  Anus: Normal exam.  No lesions.  Perineum: Normal exam.  No lesions.        Bimanual   Adnexae: No masses.  Non-tender to palpation.  Uterus: Enlarged.   Non-tender.  Mobile.  AV.  Adnexae: No masses.  Non-tender to palpation.  Cul-de-sac: Negative for abnormality.  Adnexae: No masses.  Non-tender to palpation.         Pelvimetry   Diagonal: Reached.  Spines: Average.  Sacrum: Concave.  Pubic Arch: Normal.    Exam very limited by patient body habitus.  Assessment:    G1P0000 Patient Active Problem List   Diagnosis Date Noted  . Hx of migraine headaches 01/30/2015  . Dysmenorrhea 01/30/2015  . Dyspareunia in female 01/30/2015  . Endometriosis 01/30/2015  . Anxiety and depression 01/30/2015  . Chronic hypertension 01/30/2015  . TIA (transient ischemic attack) 01/30/2015  . History of DVT (deep vein thrombosis) 01/30/2015  . History of stroke 01/30/2015     1. First trimester pregnancy   2. Morbid obesity (HCC)   3. History of DVT (deep vein thrombosis)        Plan:            1.  Refer to MFM for likely prophylaxis for DVT.  2.  Continue aspirin  3.  Patient will inform us regarding genetic testing and AFP at next visit.  4.  Pap GC/CT performed.  Orders Orders Placed This Encounter  Procedures  . AMB referral to maternal fetal medicine  . POCT urinalysis dipstick    No orders of the defined types were placed in this encounter.     F/U  Return in about 4 weeks (around 02/22/2017).  Elonda Husky, M.D. 01/25/2017 11:50 AM

## 2017-01-26 LAB — URINALYSIS, ROUTINE W REFLEX MICROSCOPIC
Bilirubin, UA: NEGATIVE
Glucose, UA: NEGATIVE
Ketones, UA: NEGATIVE
Leukocytes, UA: NEGATIVE
NITRITE UA: NEGATIVE
PH UA: 8.5 — AB (ref 5.0–7.5)
Protein, UA: NEGATIVE
RBC, UA: NEGATIVE
Specific Gravity, UA: 1.02 (ref 1.005–1.030)
UUROB: 0.2 mg/dL (ref 0.2–1.0)

## 2017-01-26 LAB — MONITOR DRUG PROFILE 14(MW)
Amphetamine Scrn, Ur: NEGATIVE ng/mL
BARBITURATE SCREEN URINE: NEGATIVE ng/mL
BENZODIAZEPINE SCREEN, URINE: NEGATIVE ng/mL
Buprenorphine, Urine: NEGATIVE ng/mL
CANNABINOIDS UR QL SCN: NEGATIVE ng/mL
COCAINE(METAB.)SCREEN, URINE: NEGATIVE ng/mL
Creatinine(Crt), U: 116.9 mg/dL (ref 20.0–300.0)
Fentanyl, Urine: NEGATIVE pg/mL
MEPERIDINE SCREEN, URINE: NEGATIVE ng/mL
Methadone Screen, Urine: NEGATIVE ng/mL
OXYCODONE+OXYMORPHONE UR QL SCN: NEGATIVE ng/mL
Opiate Scrn, Ur: NEGATIVE ng/mL
PROPOXYPHENE SCREEN URINE: NEGATIVE ng/mL
Ph of Urine: 8.1 (ref 4.5–8.9)
Phencyclidine Qn, Ur: NEGATIVE ng/mL
SPECIFIC GRAVITY: 1.022
Tramadol Screen, Urine: NEGATIVE ng/mL

## 2017-01-27 LAB — PAP IG, CT-NG, RFX HPV ASCU
CHLAMYDIA, NUC. ACID AMP: NEGATIVE
GONOCOCCUS BY NUCLEIC ACID AMP: NEGATIVE
PAP Smear Comment: 0

## 2017-02-07 ENCOUNTER — Ambulatory Visit (HOSPITAL_BASED_OUTPATIENT_CLINIC_OR_DEPARTMENT_OTHER)
Admission: RE | Admit: 2017-02-07 | Discharge: 2017-02-07 | Disposition: A | Discharge status: Home / Self Care | Payer: BLUE CROSS/BLUE SHIELD | Source: Ambulatory Visit | Attending: Maternal & Fetal Medicine | Admitting: Maternal & Fetal Medicine

## 2017-02-07 ENCOUNTER — Other Ambulatory Visit: Admission: RE | Admit: 2017-02-07 | Payer: Self-pay | Source: Ambulatory Visit | Admitting: Maternal & Fetal Medicine

## 2017-02-07 ENCOUNTER — Other Ambulatory Visit: Payer: Self-pay | Admitting: *Deleted

## 2017-02-07 ENCOUNTER — Ambulatory Visit
Admission: RE | Admit: 2017-02-07 | Discharge: 2017-02-07 | Disposition: A | Discharge status: Home / Self Care | Payer: BLUE CROSS/BLUE SHIELD | Source: Ambulatory Visit | Attending: Maternal & Fetal Medicine | Admitting: Maternal & Fetal Medicine

## 2017-02-07 VITALS — BP 132/78 | HR 94 | Temp 98.3°F | Ht 65.0 in | Wt 252.6 lb

## 2017-02-07 DIAGNOSIS — O99211 Obesity complicating pregnancy, first trimester: Secondary | ICD-10-CM | POA: Insufficient documentation

## 2017-02-07 DIAGNOSIS — Z3A12 12 weeks gestation of pregnancy: Secondary | ICD-10-CM | POA: Diagnosis not present

## 2017-02-07 DIAGNOSIS — Z8673 Personal history of transient ischemic attack (TIA), and cerebral infarction without residual deficits: Secondary | ICD-10-CM | POA: Insufficient documentation

## 2017-02-07 DIAGNOSIS — Z87891 Personal history of nicotine dependence: Secondary | ICD-10-CM | POA: Diagnosis not present

## 2017-02-07 DIAGNOSIS — Z8679 Personal history of other diseases of the circulatory system: Secondary | ICD-10-CM

## 2017-02-07 DIAGNOSIS — Z369 Encounter for antenatal screening, unspecified: Secondary | ICD-10-CM

## 2017-02-07 LAB — COMPREHENSIVE METABOLIC PANEL
ALK PHOS: 37 U/L — AB (ref 38–126)
ALT: 30 U/L (ref 14–54)
AST: 22 U/L (ref 15–41)
Albumin: 3.5 g/dL (ref 3.5–5.0)
Anion gap: 6 (ref 5–15)
BILIRUBIN TOTAL: 0.9 mg/dL (ref 0.3–1.2)
BUN: 6 mg/dL (ref 6–20)
CALCIUM: 8.6 mg/dL — AB (ref 8.9–10.3)
CO2: 21 mmol/L — AB (ref 22–32)
CREATININE: 0.39 mg/dL — AB (ref 0.44–1.00)
Chloride: 107 mmol/L (ref 101–111)
Glucose, Bld: 109 mg/dL — ABNORMAL HIGH (ref 65–99)
Potassium: 3.5 mmol/L (ref 3.5–5.1)
Sodium: 134 mmol/L — ABNORMAL LOW (ref 135–145)
Total Protein: 6.6 g/dL (ref 6.5–8.1)

## 2017-02-07 NOTE — Progress Notes (Signed)
Cindy Wiggins, Cindy Wiggins Length of Consultation: 30 minutes   Cindy Wiggins  [facility]  for an MFM consultation due to a history of stroke.  Dr. Fayrene FearingJames then requested genetic counseling to review prenatal screening and testing options.  This note summarizes the information we discussed.    We offered the following routine screening tests for this pregnancy:  First trimester screening, which includes nuchal translucency ultrasound screen and first trimester maternal serum marker screening.  The nuchal translucency has approximately an 80% detection rate for Down syndrome and can be positive for other chromosome abnormalities as well as congenital heart defects.  When combined with a maternal serum marker screening, the detection rate is up to 90% for Down syndrome and up to 97% for trisomy 18.     Maternal serum marker screening, a blood test that measures pregnancy proteins, can provide risk assessments for Down syndrome, trisomy 18, and open neural tube defects (spina bifida, anencephaly). Because it does not directly examine the fetus, it cannot positively diagnose or rule out these problems.  Targeted ultrasound uses high frequency sound waves to create an image of the developing fetus.  An ultrasound is often recommended as a routine means of evaluating the pregnancy.  It is also used to screen for fetal anatomy problems (for example, a heart defect) that might be suggestive of a chromosomal or other abnormality.   Should these screening tests indicate an increased concern, then the following additional testing options would be offered:  The chorionic villus sampling procedure is available for first trimester chromosome analysis.  This involves the withdrawal of a small amount of chorionic villi (tissue from the developing placenta).  Risk of pregnancy loss is estimated to be approximately 1 in 200 to 1 in 100 (0.5 to 1%).  There is approximately a 1% (1 in 100)  chance that the CVS chromosome results will be unclear.  Chorionic villi cannot be tested for neural tube defects.     Amniocentesis involves the removal of a small amount of amniotic fluid from the sac surrounding the fetus with the use of a thin needle inserted through the maternal abdomen and uterus.  Ultrasound guidance is used throughout the procedure.  Fetal cells from amniotic fluid are directly evaluated and > 99.5% of chromosome problems and > 98% of open neural tube defects can be detected. This procedure is generally performed after the 15th week of pregnancy.  The main risks to this procedure include complications leading to miscarriage in less than 1 in 200 cases (0.5%).  As another option for information if the pregnancy is suspected to be an an increased chance for certain chromosome conditions, we also reviewed the availability of cell free fetal DNA testing from maternal blood to determine whether or not the baby may have either Down syndrome, trisomy 7113, or trisomy 6518.  This test utilizes a maternal blood sample and DNA sequencing technology to isolate circulating cell free fetal DNA from maternal plasma.  The fetal DNA can then be analyzed for DNA sequences that are derived from the three most common chromosomes involved in aneuploidy, chromosomes 13, 18, and 21.  If the overall amount of DNA is greater than the expected level for any of these chromosomes, aneuploidy is suspected.  While we do not consider it a replacement for invasive testing and karyotype analysis, a negative result from this testing would be reassuring, though not a guarantee of a normal chromosome complement for the baby.  An abnormal  result is certainly suggestive of an abnormal chromosome complement, though we would still recommend CVS or amniocentesis to confirm any findings from this testing.  Cystic Fibrosis and Spinal Muscular Atrophy (SMA) screening were also discussed with the patient. Both conditions are  recessive, which means that both parents must be carriers in order to have a child with the disease.  Cystic fibrosis (CF) is one of the most common genetic conditions in persons of Caucasian ancestry.  This condition occurs in approximately 1 in 2,500 Caucasian persons and results in thickened secretions in the lungs, digestive, and reproductive systems.  For a baby to be at risk for having CF, both of the parents must be carriers for this condition.  Approximately 1 in 58 Caucasian persons is a carrier for CF.  Current carrier testing looks for the most common mutations in the gene for CF and can detect approximately 90% of carriers in the Caucasian population.  This means that the carrier screening can greatly reduce, but cannot eliminate, the chance for an individual to have a child with CF.  If an individual is found to be a carrier for CF, then carrier testing would be available for the partner. As part of [place]'s newborn screening profile, all babies born in the state of [place] will have a two-tier screening process.  Specimens are first tested to determine the concentration of immunoreactive trypsinogen (IRT).  The top 5% of specimens with the highest IRT values then undergo DNA testing using a panel of over 40 common CF mutations. SMA is a neurodegenerative disorder that leads to atrophy of skeletal muscle and overall weakness.  This condition is also more prevalent in the Caucasian population, with 1 in 40-1 in 60 persons being a carrier and 1 in 6,000-1 in 10,000 children being affected.  There are multiple forms of the disease, with some causing death in infancy to other forms with survival into adulthood.  The genetics of SMA is complex, but carrier screening can detect up to 95% of carriers in the Caucasian population.  Similar to CF, a negative result can greatly reduce, but cannot eliminate, the chance to have a child with SMA.  We obtained a detailed family history and pregnancy  history.  Cindy Wiggins reported that her partner has a maternal first cousin once removed with a structural heart defect, possibly transposition of the great vessels. We reviewed that most cases of structural heart defects in the absence of a other birth defects or a known genetic syndrome are multifactorial and would be expected to have a low recurrence chance in a fifth degree relative. The remainder of the family history was reported to be unremarkable for birth defects, intellectual delays, recurrent pregnancy loss or known chromosome abnormalities.  Cindy Wiggins stated that this is her first pregnancy.  She reported no complications or exposures that would be expected to increase the risk for birth defects.  See MFM consultation note for recommendations regarding her history of stroke and management for this pregnancy.  After consideration of the options, Cindy Wiggins elected to decline all genetic screening and testing options.  She would prefer to have only ultrasound, which will be scheduled as appropriate when her care is transferred to [facility].  Cindy Wiggins was encouraged to call with questions or concerns.  We can be contacted at [phone].    Cherly Anderson, MS, CGC

## 2017-02-07 NOTE — Progress Notes (Signed)
[facility] Consultation   Chief Complaint: History of stroke  HPI: Ms. Cindy Wiggins is a 31 y.o. G1 at [redacted]w[redacted]d by [redacted]w[redacted]d [place] at [facility] who presents in consultation regarding her history of stroke.    11/03/2008, after several weeks of migraine headaches, the patient "felt something pop in her brain."  She experience a left sided facial droop.  She presented to [facility] and was hospitalized for 4 days during which time she was diagnosed with stroke.  She was on Yaz OCPs at the time.  On 11/04/2008 she had a negative CT, an MRI on 11/04/2008 with "tiny foci of ischemia in the right middle cerebral artery distribution," and negative carotid artery Doppler ultrasounds.  She did not have She had resolution of her symptoms, did not require rehab and has no permanent deficits.    She followed with Dr. Corwin Levins at [facility].  He did most of a thrombophilia work-up 01/09/2009: LAC - neg AB2GP1 - neg PC - normal PS - normal AT - normal PTG - normal FVL - no result ACA - no result  On 01/13/2017 she had a normal cerebral angiogram.    She reports two subsequent episodes of headache and numbness.  One is documented by Dr. Corwin Levins on 04/13/2010.  The patient was experiencing L sided weakness.  An MRI showed T2 changes, consistent with history of migraine, but no new stroke.  Dr. Corwin Levins treated the patient for migraines and HTN.    The patient stopped her valsartan at the time of her positive pregnancy test.  She has not had a migraine in 3 years.    Gynecologic History:  History of irregular menses  Endometriosis Diagnostic laparoscopy and ablative surgery Last Pap 01/25/2017 and was negative.  GC/chlam neg the same day  Past Medical History: Patient  has a past medical history of Anxiety, Depression, Heavy period, Hypertension, IBS (irritable bowel syndrome), Painful intercourse, Painful menstrual periods, and TIAs (transient ischemic attacks) and stroke.   Past Surgical History:  She  has a past surgical history that includes laparoscopy (2013); Upper gi endoscopy; and LAPAROSCOPY OPERATIVE/ WITH EXCISION AND FULGERATION  of endometrosis (N/A, 02/17/2015).   Medications:  PNV ASA 81 mg per day  Allergies: Patient is allergic to imitrex [sumatriptan] and sumatriptan succinate. Imitrex causes vomiting.    Social History: Patient  reports that she quit smoking about 23 months ago. Her smoking use included cigarettes. She smoked 0.00 packs per day. she has never used smokeless tobacco. She reports that she drinks alcohol. She reports that she does not use drugs.   Family History: family history includes Breast cancer in her paternal aunt; Colon cancer in her father and paternal grandfather.   Review of Systems Dizziness.  Headaches.  Otherwise a full 12 point review of systems was negative or as noted in the History of Present Illness.  Physical Exam: BP 132/78   Pulse 94   Temp 98.3 F (36.8 C)   Ht 5\' 5"  (1.651 m)   Wt 252 lb 9.6 oz (114.6 kg)   LMP 11/09/2016 Comment: spotting  SpO2 96%   BMI 42.03 kg/m    Exam deferred.  Here for advice only and recently evaluated by Dr. Brennan Bailey.    Normal ECG 2016  01/21/2017 - TSH 0.695 (normal); HbA1C 5.1  Asessement: 31 yo gravida 1 at [redacted]w[redacted]d gestation with: 1. History of ischemic stroke on ASA 81 mg per day 2. History of HTN currently normotensive off of medication 3. Morbid obesity  4. History of anxiety and depression - stable, not on medication 5. Routine PN care  Recommendations: 1. History of ischemic stroke on ASA 81 mg per day  The patient was counseled that while she does not normally need anticoagulation, pregnancy increases the risk for stroke 3 to 4-fold and that we use low-molecular weight heparin to combat the hypercoagulability of pregnancy.  A prescription for 80 mg enoxaparin daily was sent to her pharmacy.  ACA and FVL were ordered today to complete her thrombophilia work-up.  2. History of  HTN currently hypertensive off of medication  The patient was reassured that she does not need an antihypertensive now.     She would be a candidate for the CHAP study.  Fetal surveillance as outlined below.  3. Morbid obesity Now:  We would recommend an extra 1 mg per day of  folic acid.    Because the patient snores, we would recommend pursuing an evaluation for sleep apnea.   We would recommend baseline laboratory studies to include a complete metabolic profile and a P/C ratio.  The patient should have an early glucose screen.  This was ordered today.  The patient was counseled previously and again today about limiting her weight gain this pregnancy to 10-15 lbs.  The patient should have a detailed anatomy ultrasound.  This will be scheduled at a future visit. Third trimester:  We would recommend fetal surveillance to include monthly ultrasounds starting at 28 weeks, weekly antepartum testing starting at [redacted] weeks gestation and delivery by [redacted] weeks gestation.  If she develops gestational diabetes, preeclampsia or other complication the intensity of surveillance would need to be increased. At delivery:  We would recommend pneumatic compression devices in labor and postpartum.  We would recommend consideration of 3 gm instead of 2 gm of cefazolin for prophylaxis at the time of a cesarean delivery.  At the time of a cesarean delivery, we would recommend application of a negative pressure dressing.   Postpartum:  We would recommend referral for weight loss management and consideration of bariatric surgery. 4. History of anxiety and depression - stable, not on medication 5. Routine PN care  The patient told me plans [facility]  I recommended the MFM practice.    Total time spent with the patient was 60 minutes with greater than 50% spent in counseling and coordination of care.  We appreciate this consult and will be happy to be involved in the  ongoing care.  Argentina PonderAndra H. Quamaine Webb, MD [facility]  Addendum: ACA - pending FVL - not drawn CMP - normal P/C ratio - neg Glucose screen - 109  Argentina PonderAndra H. Nazire Fruth, MD [facility]

## 2017-02-09 LAB — CARDIOLIPIN ANTIBODIES, IGG, IGM, IGA
ANTICARDIOLIPIN IGM: 9 [MPL'U]/mL (ref 0–12)
Anticardiolipin IgA: <9 APL U/mL (ref 0–11)

## 2017-02-22 ENCOUNTER — Encounter: Payer: BLUE CROSS/BLUE SHIELD | Admitting: Obstetrics and Gynecology

## 2017-03-02 ENCOUNTER — Ambulatory Visit: Payer: BLUE CROSS/BLUE SHIELD | Attending: Neurology

## 2017-03-02 DIAGNOSIS — Z3A00 Weeks of gestation of pregnancy not specified: Secondary | ICD-10-CM | POA: Insufficient documentation

## 2017-03-02 DIAGNOSIS — G4761 Periodic limb movement disorder: Secondary | ICD-10-CM | POA: Insufficient documentation

## 2017-03-02 DIAGNOSIS — F5101 Primary insomnia: Secondary | ICD-10-CM | POA: Diagnosis not present

## 2017-03-02 DIAGNOSIS — G4733 Obstructive sleep apnea (adult) (pediatric): Secondary | ICD-10-CM | POA: Insufficient documentation

## 2017-03-02 DIAGNOSIS — R0683 Snoring: Secondary | ICD-10-CM | POA: Insufficient documentation

## 2017-03-02 DIAGNOSIS — O99350 Diseases of the nervous system complicating pregnancy, unspecified trimester: Secondary | ICD-10-CM | POA: Insufficient documentation

## 2017-08-08 ENCOUNTER — Encounter: Payer: Self-pay | Admitting: Emergency Medicine

## 2017-08-08 ENCOUNTER — Emergency Department
Admission: EM | Admit: 2017-08-08 | Discharge: 2017-08-08 | Disposition: A | Discharge status: Home / Self Care | Payer: BLUE CROSS/BLUE SHIELD | Attending: Emergency Medicine | Admitting: Emergency Medicine

## 2017-08-08 DIAGNOSIS — R600 Localized edema: Secondary | ICD-10-CM | POA: Insufficient documentation

## 2017-08-08 DIAGNOSIS — R51 Headache: Secondary | ICD-10-CM | POA: Diagnosis not present

## 2017-08-08 DIAGNOSIS — Z5321 Procedure and treatment not carried out due to patient leaving prior to being seen by health care provider: Secondary | ICD-10-CM | POA: Insufficient documentation

## 2017-08-08 DIAGNOSIS — I10 Essential (primary) hypertension: Secondary | ICD-10-CM | POA: Diagnosis present

## 2017-08-08 LAB — CBC WITH DIFFERENTIAL/PLATELET
Basophils Absolute: 0 10*3/uL (ref 0–0.1)
Basophils Relative: 1 %
EOS PCT: 3 %
Eosinophils Absolute: 0.1 10*3/uL (ref 0–0.7)
HCT: 34 % — ABNORMAL LOW (ref 35.0–47.0)
Hemoglobin: 11.5 g/dL — ABNORMAL LOW (ref 12.0–16.0)
LYMPHS ABS: 1.5 10*3/uL (ref 1.0–3.6)
Lymphocytes Relative: 25 %
MCH: 28.5 pg (ref 26.0–34.0)
MCHC: 33.7 g/dL (ref 32.0–36.0)
MCV: 84.6 fL (ref 80.0–100.0)
MONO ABS: 0.4 10*3/uL (ref 0.2–0.9)
Monocytes Relative: 8 %
Neutro Abs: 3.8 10*3/uL (ref 1.4–6.5)
Neutrophils Relative %: 65 %
PLATELETS: 285 10*3/uL (ref 150–440)
RBC: 4.02 MIL/uL (ref 3.80–5.20)
RDW: 13.6 % (ref 11.5–14.5)
WBC: 5.9 10*3/uL (ref 3.6–11.0)

## 2017-08-08 LAB — COMPREHENSIVE METABOLIC PANEL
ALT: 17 U/L (ref 14–54)
AST: 18 U/L (ref 15–41)
Albumin: 3.3 g/dL — ABNORMAL LOW (ref 3.5–5.0)
Alkaline Phosphatase: 75 U/L (ref 38–126)
Anion gap: 7 (ref 5–15)
BUN: 17 mg/dL (ref 6–20)
CO2: 22 mmol/L (ref 22–32)
CREATININE: 0.85 mg/dL (ref 0.44–1.00)
Calcium: 8.3 mg/dL — ABNORMAL LOW (ref 8.9–10.3)
Chloride: 109 mmol/L (ref 101–111)
Glucose, Bld: 102 mg/dL — ABNORMAL HIGH (ref 65–99)
POTASSIUM: 3.7 mmol/L (ref 3.5–5.1)
Sodium: 138 mmol/L (ref 135–145)
Total Bilirubin: 0.4 mg/dL (ref 0.3–1.2)
Total Protein: 6.3 g/dL — ABNORMAL LOW (ref 6.5–8.1)

## 2017-08-08 NOTE — ED Triage Notes (Signed)
Pt arrived with mother with complaints of extremity swelling and high blood pressure. Pt reports a blood pressure reading of 161/111 at home. In triage patient's blood pressure 134/81. Pt recently had a full term vaginal delivery for her first child. Pt states she had HTN prior to pregnancy but no HTN during pregnancy. Pt does report tachycardia with the end of her pregnancy. Pt also reports mild headache that has been present for the last week. Pedal pulses palpable bilaterally.

## 2017-08-09 ENCOUNTER — Telehealth: Payer: Self-pay | Admitting: Emergency Medicine

## 2017-08-09 NOTE — Telephone Encounter (Signed)
Called patient due to lwot to inquire about condition and follow up plans. Left message.   

## 2018-01-18 ENCOUNTER — Encounter: Payer: BLUE CROSS/BLUE SHIELD | Admitting: Obstetrics and Gynecology

## 2020-05-21 ENCOUNTER — Telehealth: Payer: Self-pay | Admitting: Family

## 2020-05-21 ENCOUNTER — Telehealth: Payer: Self-pay | Admitting: Unknown Physician Specialty

## 2020-05-21 DIAGNOSIS — U071 COVID-19: Secondary | ICD-10-CM

## 2020-05-21 NOTE — Telephone Encounter (Signed)
Called to discuss with patient about COVID-19 symptoms and the use of one of the available treatments for those with mild to moderate Covid symptoms and at a high risk of hospitalization.  Pt appears to qualify for outpatient treatment due to co-morbid conditions and/or a member of an at-risk group in accordance with the FDA Emergency Use Authorization.    Unable to reach pt - LMOM and mychart   Aaronmichael Brumbaugh   

## 2020-05-21 NOTE — Telephone Encounter (Signed)
Spoke with Cindy Wiggins's husband as part of COVID 19 treatment team. He tested positive for COVID19 05/20/20. Mrs. Eleftheria Taborn started symptoms and tested positive on home test 05/21/20. She is unvaccinated against COVID19 with risk factors including HTN, obesity. She is unable to receive MAB today with her husband due to childcare. I have placed a referral for COVID19 treatment so that she will be in the algorithim and receive a call tomorrow regarding scheduling treatment. I anticipate treatment will be scheduled for 05/23/20 as the clinic is closed 05/22/20.   Alver Sorrow, NP

## 2020-05-22 ENCOUNTER — Telehealth: Payer: Self-pay | Admitting: Family

## 2020-05-22 ENCOUNTER — Other Ambulatory Visit: Payer: Self-pay | Admitting: Family

## 2020-05-22 DIAGNOSIS — Z8673 Personal history of transient ischemic attack (TIA), and cerebral infarction without residual deficits: Secondary | ICD-10-CM

## 2020-05-22 DIAGNOSIS — U071 COVID-19: Secondary | ICD-10-CM

## 2020-05-22 DIAGNOSIS — I10 Essential (primary) hypertension: Secondary | ICD-10-CM

## 2020-05-22 NOTE — Progress Notes (Signed)
I connected by phone with Cindy Wiggins on 05/22/2020 at 2:15 PM to discuss the potential use of a new treatment for mild to moderate COVID-19 viral infection in non-hospitalized patients.  This patient is a 35 y.o. female that meets the FDA criteria for Emergency Use Authorization of COVID monoclonal antibody sotrovimab.  Has a (+) direct SARS-CoV-2 viral test result  Has mild or moderate COVID-19   Is NOT hospitalized due to COVID-19  Is within 10 days of symptom onset  Has at least one of the high risk factor(s) for progression to severe COVID-19 and/or hospitalization as defined in EUA.  Specific high risk criteria : BMI > 25 and Cardiovascular disease or hypertension   I have spoken and communicated the following to the patient or parent/caregiver regarding COVID monoclonal antibody treatment:  1. FDA has authorized the emergency use for the treatment of mild to moderate COVID-19 in adults and pediatric patients with positive results of direct SARS-CoV-2 viral testing who are 68 years of age and older weighing at least 40 kg, and who are at high risk for progressing to severe COVID-19 and/or hospitalization.  2. The significant known and potential risks and benefits of COVID monoclonal antibody, and the extent to which such potential risks and benefits are unknown.  3. Information on available alternative treatments and the risks and benefits of those alternatives, including clinical trials.  4. Patients treated with COVID monoclonal antibody should continue to self-isolate and use infection control measures (e.g., wear mask, isolate, social distance, avoid sharing personal items, clean and disinfect "high touch" surfaces, and frequent handwashing) according to CDC guidelines.   5. The patient or parent/caregiver has the option to accept or refuse COVID monoclonal antibody treatment.  After reviewing this information with the patient, the patient has agreed to receive one of the  available covid 19 monoclonal antibodies and will be provided an appropriate fact sheet prior to infusion.   [name], FNP 05/22/2020 2:15 PM

## 2020-05-22 NOTE — Telephone Encounter (Signed)
Follow up call.  Cindy Wiggins had symptoms staring on 2/16. Currently having achy, fatigue, congestion, and headache. Not currently vaccinated. Tested positive through home test. Qualifying factors include hypertension and obesity.   We discussed the risks, benefits and potential financial costs associated with treatment with Sotrovimab and she wishes to continue with treatment.   Hello Cindy Wiggins,   We contacted you because you were recently diagnosed with COVID-19 and may benefit from a new treatment for mild to moderate disease. This treatment helps reduce the chance of being hospitalized. For some patients with medical conditions that may increase the chances of an infection, the treatment also decreases the risk for serious symptoms related to COVID-19.   The Food and Drug Administration (FDA) approved emergency use of a new drug to treat patients with mild to moderate symptoms who have risk factors that could cause severe symptoms related to COVID-19. This new treatment is a monoclonal antibody. It works by attaching like a magnet to the SARS-CoV2 virus (the virus that causes COVID-19) and stops it from infecting more cells in your body. It does not kill the virus, but it prevents it from spreading throughout your body with the hope that it will decrease your symptoms after it is administered.   This new drug is an intravenous (IV) infusion called Sotrovimab that is given over one 30-minute session in our [facility] outpatient infusion clinic. You will need to stay about 60 minutes after the infusion to ensure you are tolerating it well and to watch for any allergic reaction to the medication. More information will be given to you at the time of your appointment.   Important information:  . The potential side effects: 2-4% of recipients experience nausea, vomiting, diarrhea, dizziness, headaches, itching, worsening fevers or chills for around 24 hours. . There have been no serious  infusion-related reactions. . Of the more than 3,000 patients who received the infusion, only one had an allergic response that ended once the infusion was stopped. This is why we monitor all of our patients closely for 60 minutes after the infusion.  . The COVID-19 vaccine (including boosters) must be delayed at least 90 days after receiving this infusion.  . The medication itself is free, but your insurance will be charged an infusion fee. The amount you may owe later varies from insurance to insurance. If you do not have insurance, we can put you in touch with our billing department. Please contact your insurance agent to discuss prior to your appointment if you would like further details about billing specific to your policy. The CMS code is: M34  . If you have been tested outside of a [facility], you MUST bring a copy of your positive test with you the morning of your appointment. You may take a photo of this and upload to your MyChart portal,  have the testing facility fax the result to [phone] or email a copy to [email or web address].    You have been scheduled to receive the monoclonal antibody therapy at [facility]:  05/23/20 at 12:30 pm   The address for the infusion clinic site is:   --The GPS address is [address], and the parking is located near the [employer] building where you will see a "COVID19 Infusion" feather banner marking the entrance to parking. (See photos below.)            --Enter into the 2nd entrance where the "wave, flag banner" is at the road.  Turn into this 2nd entrance and immediately turn left or right to park in one of the marked spaces.   --Please stay in your car and call the desk for assistance inside at [phone]. Let us know which space you are in.    --The average time in the department is roughly 90 minutes to two hours for monoclonal treatment. This includes preparation of the medication, IV start and the required  60-minute monitoring after the infusion.     Should you develop worsening shortness of breath, chest pain or severe breathing problems, please do not wait for this appointment and go to the emergency room for evaluation and treatment instead. You will undergo another oxygen screen before your infusion to ensure this is the best treatment option for you. There is a chance that the best decision may be to send you to the emergency room for evaluation at the time of your appointment.    The day of your visit, you should: Marland Kitchen Get plenty of rest the night before and drink plenty of water. . Eat a light meal/snack before coming and take your medications as prescribed.  . Wear warm, comfortable clothes with a shirt that can roll-up over the elbow (will need IV start).  . Wear a mask.  . Consider bringing an activity to help pass the time.  Marcos Eke, NP 05/22/2020 2:13 PM

## 2020-05-23 ENCOUNTER — Ambulatory Visit (HOSPITAL_COMMUNITY)
Admission: RE | Admit: 2020-05-23 | Discharge: 2020-05-23 | Disposition: A | Discharge status: Home / Self Care | Payer: BC Managed Care – PPO | Source: Ambulatory Visit | Attending: Pulmonary Disease | Admitting: Pulmonary Disease

## 2020-05-23 DIAGNOSIS — Z6841 Body Mass Index (BMI) 40.0 and over, adult: Secondary | ICD-10-CM | POA: Insufficient documentation

## 2020-05-23 DIAGNOSIS — U071 COVID-19: Secondary | ICD-10-CM | POA: Diagnosis present

## 2020-05-23 DIAGNOSIS — I10 Essential (primary) hypertension: Secondary | ICD-10-CM | POA: Insufficient documentation

## 2020-05-23 DIAGNOSIS — Z8673 Personal history of transient ischemic attack (TIA), and cerebral infarction without residual deficits: Secondary | ICD-10-CM | POA: Diagnosis not present

## 2020-05-23 MED ORDER — FAMOTIDINE IN NACL 20-0.9 MG/50ML-% IV SOLN: 20.0000 mg | Freq: Once | INTRAVENOUS | Status: DC | PRN

## 2020-05-23 MED ORDER — DIPHENHYDRAMINE HCL 50 MG/ML IJ SOLN: 50.0000 mg | Freq: Once | INTRAMUSCULAR | Status: DC | PRN

## 2020-05-23 MED ORDER — SODIUM CHLORIDE 0.9 % IV SOLN: INTRAVENOUS | Status: DC | PRN

## 2020-05-23 MED ORDER — METHYLPREDNISOLONE SODIUM SUCC 125 MG IJ SOLR: 125.0000 mg | Freq: Once | INTRAMUSCULAR | Status: DC | PRN

## 2020-05-23 MED ORDER — EPINEPHRINE 0.3 MG/0.3ML IJ SOAJ: 0.3000 mg | Freq: Once | INTRAMUSCULAR | Status: DC | PRN

## 2020-05-23 MED ORDER — ALBUTEROL SULFATE HFA 108 (90 BASE) MCG/ACT IN AERS: 2.0000 | INHALATION_SPRAY | Freq: Once | RESPIRATORY_TRACT | Status: DC | PRN

## 2020-05-23 MED ORDER — SOTROVIMAB 500 MG/8ML IV SOLN
500.0000 mg | Freq: Once | INTRAVENOUS | Status: AC
  Administered 2020-05-23: 500 mg via INTRAVENOUS

## 2020-05-23 NOTE — Progress Notes (Signed)
Patient reviewed Fact Sheet for Patients, Parents, and Caregivers for Emergency Use Authorization (EUA) of sotrovimab for the Treatment of Coronavirus. Patient also reviewed and is agreeable to the estimated cost of treatment. Patient is agreeable to proceed.   

## 2020-05-23 NOTE — Progress Notes (Signed)
Diagnosis: COVID-19  Physician: Dr. Patrick Wright  Procedure: Covid Infusion Clinic Med: Sotrovimab infusion - Provided patient with sotrovimab fact sheet for patients, parents, and caregivers prior to infusion.   Complications: No immediate complications noted  Discharge: Discharged home    

## 2020-05-23 NOTE — Discharge Instructions (Signed)

## 2021-05-07 ENCOUNTER — Other Ambulatory Visit: Payer: Self-pay | Admitting: Family Medicine

## 2021-05-07 DIAGNOSIS — R109 Unspecified abdominal pain: Secondary | ICD-10-CM

## 2021-05-07 DIAGNOSIS — R112 Nausea with vomiting, unspecified: Secondary | ICD-10-CM

## 2021-05-15 ENCOUNTER — Ambulatory Visit
Admission: RE | Admit: 2021-05-15 | Discharge: 2021-05-15 | Disposition: A | Discharge status: Home / Self Care | Payer: BC Managed Care – PPO | Source: Ambulatory Visit | Attending: Family Medicine | Admitting: Family Medicine

## 2021-05-15 ENCOUNTER — Other Ambulatory Visit: Payer: Self-pay

## 2021-05-15 DIAGNOSIS — R109 Unspecified abdominal pain: Secondary | ICD-10-CM | POA: Insufficient documentation

## 2021-05-15 DIAGNOSIS — R112 Nausea with vomiting, unspecified: Secondary | ICD-10-CM | POA: Insufficient documentation

## 2021-05-22 ENCOUNTER — Other Ambulatory Visit: Payer: Self-pay | Admitting: Family Medicine

## 2021-05-22 DIAGNOSIS — R109 Unspecified abdominal pain: Secondary | ICD-10-CM

## 2021-05-22 DIAGNOSIS — R112 Nausea with vomiting, unspecified: Secondary | ICD-10-CM

## 2022-01-07 DIAGNOSIS — S93422A Sprain of deltoid ligament of left ankle, initial encounter: Secondary | ICD-10-CM | POA: Diagnosis not present

## 2022-01-07 DIAGNOSIS — S8265XA Nondisplaced fracture of lateral malleolus of left fibula, initial encounter for closed fracture: Secondary | ICD-10-CM | POA: Diagnosis not present

## 2022-01-09 DIAGNOSIS — G4733 Obstructive sleep apnea (adult) (pediatric): Secondary | ICD-10-CM | POA: Diagnosis not present

## 2022-01-25 DIAGNOSIS — S93422A Sprain of deltoid ligament of left ankle, initial encounter: Secondary | ICD-10-CM | POA: Diagnosis not present

## 2022-03-26 DIAGNOSIS — G4733 Obstructive sleep apnea (adult) (pediatric): Secondary | ICD-10-CM | POA: Diagnosis not present

## 2022-04-08 ENCOUNTER — Emergency Department: Payer: BC Managed Care – PPO

## 2022-04-08 ENCOUNTER — Other Ambulatory Visit: Payer: Self-pay

## 2022-04-08 ENCOUNTER — Emergency Department
Admission: EM | Admit: 2022-04-08 | Discharge: 2022-04-08 | Disposition: A | Discharge status: Home / Self Care | Payer: BC Managed Care – PPO | Attending: Emergency Medicine | Admitting: Emergency Medicine

## 2022-04-08 ENCOUNTER — Encounter: Payer: Self-pay | Admitting: Emergency Medicine

## 2022-04-08 DIAGNOSIS — R202 Paresthesia of skin: Secondary | ICD-10-CM | POA: Insufficient documentation

## 2022-04-08 DIAGNOSIS — I10 Essential (primary) hypertension: Secondary | ICD-10-CM | POA: Insufficient documentation

## 2022-04-08 DIAGNOSIS — R079 Chest pain, unspecified: Secondary | ICD-10-CM

## 2022-04-08 DIAGNOSIS — R0789 Other chest pain: Secondary | ICD-10-CM | POA: Diagnosis not present

## 2022-04-08 LAB — BASIC METABOLIC PANEL
Anion gap: 11 (ref 5–15)
BUN: 13 mg/dL (ref 6–20)
CO2: 23 mmol/L (ref 22–32)
Calcium: 9.1 mg/dL (ref 8.9–10.3)
Chloride: 102 mmol/L (ref 98–111)
Creatinine, Ser: 0.76 mg/dL (ref 0.44–1.00)
GFR, Estimated: >60 mL/min (ref >60)
Glucose, Bld: 100 mg/dL — ABNORMAL HIGH (ref 70–99)
Potassium: 3.3 mmol/L — ABNORMAL LOW (ref 3.5–5.1)
Sodium: 136 mmol/L (ref 135–145)

## 2022-04-08 LAB — CBC
HCT: 40.6 % (ref 36.0–46.0)
Hemoglobin: 13.4 g/dL (ref 12.0–15.0)
MCH: 27 pg (ref 26.0–34.0)
MCHC: 33 g/dL (ref 30.0–36.0)
MCV: 81.7 fL (ref 80.0–100.0)
Platelets: 245 10*3/uL (ref 150–400)
RBC: 4.97 MIL/uL (ref 3.87–5.11)
RDW: 12.8 % (ref 11.5–15.5)
WBC: 6.3 10*3/uL (ref 4.0–10.5)
nRBC: 0 % (ref 0.0–0.2)

## 2022-04-08 LAB — TROPONIN I (HIGH SENSITIVITY)
Troponin I (High Sensitivity): <2 ng/L (ref <18)
Troponin I (High Sensitivity): 3 ng/L (ref <18)

## 2022-04-08 LAB — LIPASE, BLOOD: Lipase: 36 U/L (ref 11–51)

## 2022-04-08 MED ORDER — POTASSIUM CHLORIDE CRYS ER 20 MEQ PO TBCR
40.0000 meq | EXTENDED_RELEASE_TABLET | Freq: Once | ORAL | Status: AC
  Administered 2022-04-08: 40 meq via ORAL
  Filled 2022-04-08: qty 2

## 2022-04-08 MED ORDER — IOHEXOL 350 MG/ML SOLN
75.0000 mL | Freq: Once | INTRAVENOUS | Status: AC | PRN
  Administered 2022-04-08: 75 mL via INTRAVENOUS

## 2022-04-08 NOTE — Discharge Instructions (Addendum)
-  We are unsure exactly what the cause of your pain is, however it does not appear to be anything serious or life-threatening.  -Please continue to monitor your symptoms throughout the week.  You may try Tylenol and/or ibuprofen as needed for pain.  -If your symptoms persist, please follow-up with cardiology.  They should be contacting you within the next few days.  If not, you may contact them using the information listed.  -Return to the emergency department anytime if you begin to experience any new or worsening symptoms.

## 2022-04-08 NOTE — ED Triage Notes (Signed)
Pt here with cp/upper left flank pain that started about an hour ago. Pt states she has a hx of a stroke and was concerned. Pt states the pain is worse when she breathes in. Pt denies radiation. Pt also c/o some left hand numbness.

## 2022-04-08 NOTE — ED Provider Notes (Signed)
[facility] Provider Note    Event Date/Time   First MD Initiated Contact with Patient 04/08/22 1246     (approximate)   History   Chief Complaint Chest Pain   HPI Cindy Wiggins is a 37 y.o. female, history of TIA, hypertension, depression, anxiety, IBS, presents to the emergency department for evaluation of left-sided chest pain x 1 day.  She states that it started within the past couple hours, described as a sharp pain in the left side of her chest that hurts worse whenever she breathes then.  Complaining of some left hand numbness as well.  She expressed concern for possible blood clots given her history of hypertension, DVT, and TIA.  She is not currently on blood thinners at this time.  Denies fever/chills, shortness of breath, abdominal pain, nausea/vomiting, diarrhea, urinary symptoms, headache, dizziness/lightheadedness, or weakness.  History Limitations: No limitations.        Physical Exam  Triage Vital Signs: ED Triage Vitals [04/08/22 1225]  Enc Vitals Group     BP (!) 119/102     Pulse Rate (!) 102     Resp 18     Temp 98.2 F (36.8 C)     Temp Source Oral     SpO2 96 %     Weight 276 lb (125.2 kg)     Height 5\' 5"  (1.651 m)     Head Circumference      Peak Flow      Pain Score 6     Pain Loc      Pain Edu?      Excl. in [place]?     Most recent vital signs: Vitals:   04/08/22 1225  BP: (!) 119/102  Pulse: (!) 102  Resp: 18  Temp: 98.2 F (36.8 C)  SpO2: 96%    General: Awake, NAD.  Skin: Warm, dry. No rashes or lesions.  Eyes: PERRL. Conjunctivae normal.  CV: Good peripheral perfusion.  S1 and S2 present.  No murmurs, rubs, or gallops.  No chest wall tenderness. Resp: Normal effort.  Lung sounds are clear bilaterally. Abd: Soft, non-tender. No distention.  Neuro: At baseline. No gross neurological deficits.  Musculoskeletal: Normal ROM of all extremities.   Physical Exam    ED Results / Procedures / Treatments   Labs (all labs ordered are listed, but only abnormal results are displayed) Labs Reviewed  BASIC METABOLIC PANEL - Abnormal; Notable for the following components:      Result Value   Potassium 3.3 (*)    Glucose, Bld 100 (*)    All other components within normal limits  CBC  LIPASE, BLOOD  POC URINE PREG, ED  TROPONIN I (HIGH SENSITIVITY)  TROPONIN I (HIGH SENSITIVITY)     EKG Sinus tachycardia, rate of 102, no T-segment changes, normal QRS, no QT prolongation, no significant axis deviations.    RADIOLOGY  ED Provider Interpretation: I personally reviewed and interpreted the chest x-ray, no evidence of acute cardiopulmonary abnormalities.  CT angiogram shows no acute cardio pulmonary abnormalities.  CT Angio Chest PE W and/or Wo Contrast  Result Date: 04/08/2022 CLINICAL DATA:  Pulmonary embolism (PE) suspected, high prob EXAM: CT ANGIOGRAPHY CHEST WITH CONTRAST TECHNIQUE: Multidetector CT imaging of the chest was performed using the standard protocol during bolus administration of intravenous contrast. Multiplanar CT image reconstructions and MIPs were obtained to evaluate the vascular anatomy. RADIATION DOSE REDUCTION: This exam was performed according to the departmental dose-optimization program which includes automated  exposure control, adjustment of the mA and/or kV according to patient size and/or use of iterative reconstruction technique. CONTRAST:  75 mL OMNIPAQUE IOHEXOL 350 MG/ML SOLN non-ionic IV contrast. COMPARISON:  None Available. FINDINGS: Cardiovascular: Satisfactory opacification of the pulmonary arteries to the segmental level. No evidence of pulmonary embolism. Heart size within normal limits. No pericardial effusion. Mediastinum/Nodes: No enlarged mediastinal, hilar, or axillary lymph nodes. Thyroid gland, trachea, and esophagus demonstrate no significant findings. Lungs/Pleura: Minimal linear dependent subsegmental atelectasis or scarring left base. Lungs are  otherwise clear. No pleural effusion or pneumothorax. Upper Abdomen: No acute abnormality. Musculoskeletal: No chest wall abnormality. No acute or significant osseous findings. Review of the MIP images confirms the above findings. IMPRESSION: No evidence of PE, aneurysm or dissection. Electronically Signed   By: Sammie Bench M.D.   On: 04/08/2022 14:39   DG Chest 2 View  Result Date: 04/08/2022 CLINICAL DATA:  Chest pain EXAM: CHEST - 2 VIEW COMPARISON:  None Available. FINDINGS: Midline trachea.  Normal heart size and mediastinal contours. Sharp costophrenic angles.  No pneumothorax.  Clear lungs. IMPRESSION: No active cardiopulmonary disease. Electronically Signed   By: Abigail Miyamoto M.D.   On: 04/08/2022 13:12    PROCEDURES:  Critical Care performed: N/A.  Procedures    MEDICATIONS ORDERED IN ED: Medications  potassium chloride SA (KLOR-CON M) CR tablet 40 mEq (40 mEq Oral Given 04/08/22 1326)  iohexol (OMNIPAQUE) 350 MG/ML injection 75 mL (75 mLs Intravenous Contrast Given 04/08/22 1416)     IMPRESSION / MDM / ASSESSMENT AND PLAN / ED COURSE  I reviewed the triage vital signs and the nursing notes.                              Differential diagnosis includes, but is not limited to, ACS, myocarditis, pericarditis, Boerhaave syndrome, pneumothorax, costochondritis, anxiety, pulmonary embolism, pneumonia.  ED Course Appears well, slightly tachycardic at 102, otherwise normal vitals.  NAD.  CBC shows no leukocytosis or anemia.  BMP shows mild hypokalemia at 3.3.  Will provide oral supplementation.  Otherwise no electrolyte abnormalities or AKI.  Lipase unremarkable at 36.  Initial troponin less than 2.  Unlikely ACS, myocarditis, or pericarditis given normal EKG.  Assessment/Plan Patient presents with chest pain x 1 day.  She states that since being here her pain has somewhat subsided, though is still present.  Lab workup is reassuring.  CT angiogram shows no evidence of  pulmonary embolism or infection.  EKG is reassuring.  Troponin shows no evidence of ACS, myocarditis, or pericarditis.  Unclear the exact etiology of the patient's symptoms, however very low suspicion for any serious or life-threatening pathology.  She states that this has happened for intermittently throughout the past month.  Referral to cardiology to follow-up with if her symptoms persist.  She was amenable to this.  Will discharge.  Considered admission for this patient, but given her stable presentation and unremarkable workup, she will likely benefit from admission.  Provided the patient with anticipatory guidance, return precautions, and educational material. Encouraged the patient to return to the emergency department at any time if they begin to experience any new or worsening symptoms. Patient expressed understanding and agreed with the plan.   Patient's presentation is most consistent with acute complicated illness / injury requiring diagnostic workup.      FINAL CLINICAL IMPRESSION(S) / ED DIAGNOSES   Final diagnoses:  Nonspecific chest pain     Rx /  DC Orders   ED Discharge Orders          Ordered    Ambulatory referral to Cardiology       Comments: If you have not heard from the Cardiology office within the next 72 hours please call [phone].   04/08/22 1449             Note:  This document was prepared using Dragon voice recognition software and may include unintentional dictation errors.   Varney Daily, [place] 04/08/22 1450    Sharyn Creamer, MD 04/08/22 2015

## 2023-01-31 DIAGNOSIS — I10 Essential (primary) hypertension: Secondary | ICD-10-CM | POA: Diagnosis not present

## 2023-01-31 DIAGNOSIS — G4733 Obstructive sleep apnea (adult) (pediatric): Secondary | ICD-10-CM | POA: Diagnosis not present

## 2023-01-31 DIAGNOSIS — Z6841 Body Mass Index (BMI) 40.0 and over, adult: Secondary | ICD-10-CM | POA: Diagnosis not present

## 2023-01-31 DIAGNOSIS — F988 Other specified behavioral and emotional disorders with onset usually occurring in childhood and adolescence: Secondary | ICD-10-CM | POA: Diagnosis not present

## 2023-03-28 DIAGNOSIS — G4733 Obstructive sleep apnea (adult) (pediatric): Secondary | ICD-10-CM | POA: Diagnosis not present
